# Patient Record
Sex: Male | Born: 1995
Health system: Southern US, Community
[De-identification: ages and names within clinical notes are randomized; demographics above are authoritative.]

## PROBLEM LIST (undated history)

## (undated) DIAGNOSIS — F988 Other specified behavioral and emotional disorders with onset usually occurring in childhood and adolescence: Secondary | ICD-10-CM

## (undated) HISTORY — DX: Other specified behavioral and emotional disorders with onset usually occurring in childhood and adolescence: F98.8

---
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
## 2013-02-19 ENCOUNTER — Encounter: Payer: Self-pay | Admitting: Internal Medicine

## 2013-02-20 ENCOUNTER — Encounter: Payer: Self-pay | Admitting: Internal Medicine

## 2013-02-20 ENCOUNTER — Ambulatory Visit (INDEPENDENT_AMBULATORY_CARE_PROVIDER_SITE_OTHER): Payer: Commercial Managed Care - PPO | Admitting: Internal Medicine

## 2013-02-20 VITALS — BP 142/90 | HR 76 | Temp 99.5°F | Resp 16 | Ht 70.75 in | Wt 161.2 lb

## 2013-02-20 DIAGNOSIS — E559 Vitamin D deficiency, unspecified: Secondary | ICD-10-CM

## 2013-02-20 DIAGNOSIS — Z79899 Other long term (current) drug therapy: Secondary | ICD-10-CM

## 2013-02-20 DIAGNOSIS — R509 Fever, unspecified: Secondary | ICD-10-CM

## 2013-02-20 DIAGNOSIS — R634 Abnormal weight loss: Secondary | ICD-10-CM

## 2013-02-20 DIAGNOSIS — R030 Elevated blood-pressure reading, without diagnosis of hypertension: Secondary | ICD-10-CM

## 2013-02-20 LAB — CBC WITH DIFFERENTIAL/PLATELET
Basophils Absolute: 0 10*3/uL (ref 0.0–0.1)
Basophils Relative: 1 % (ref 0–1)
Eosinophils Absolute: 0 10*3/uL (ref 0.0–0.7)
HCT: 43.8 % (ref 39.0–52.0)
Hemoglobin: 15.7 g/dL (ref 13.0–17.0)
MCH: 29.8 pg (ref 26.0–34.0)
MCHC: 35.8 g/dL (ref 30.0–36.0)
Monocytes Absolute: 0.4 10*3/uL (ref 0.1–1.0)
Monocytes Relative: 7 % (ref 3–12)
Neutro Abs: 4.3 10*3/uL (ref 1.7–7.7)
Neutrophils Relative %: 68 % (ref 43–77)
Platelets: 234 10*3/uL (ref 150–400)

## 2013-02-20 LAB — BASIC METABOLIC PANEL WITH GFR
Calcium: 10.6 mg/dL — ABNORMAL HIGH (ref 8.4–10.5)
Creat: 0.94 mg/dL (ref 0.50–1.35)
GFR, Est African American: >89 mL/min
GFR, Est Non African American: >89 mL/min
Glucose, Bld: 86 mg/dL (ref 70–99)
Potassium: 4.6 mEq/L (ref 3.5–5.3)
Sodium: 141 mEq/L (ref 135–145)

## 2013-02-20 LAB — HEPATIC FUNCTION PANEL
ALT: 14 U/L (ref 0–53)
Bilirubin, Direct: 0.4 mg/dL — ABNORMAL HIGH (ref 0.0–0.3)
Indirect Bilirubin: 1.3 mg/dL — ABNORMAL HIGH (ref 0.0–0.9)
Total Bilirubin: 1.7 mg/dL — ABNORMAL HIGH (ref 0.3–1.2)

## 2013-02-20 LAB — TSH: TSH: 1.32 u[IU]/mL (ref 0.350–4.500)

## 2013-02-20 NOTE — Patient Instructions (Signed)
Hypertension As your heart beats, it forces blood through your arteries. This force is your blood pressure. If the pressure is too high, it is called hypertension (HTN) or high blood pressure. HTN is dangerous because you may have it and not know it. High blood pressure may mean that your heart has to work harder to pump blood. Your arteries may be narrow or stiff. The extra work puts you at risk for heart disease, stroke, and other problems.  Blood pressure consists of two numbers, a higher number over a lower, 110/72, for example. It is stated as "110 over 72." The ideal is below 120 for the top number (systolic) and under 80 for the bottom (diastolic). Write down your blood pressure today. You should pay close attention to your blood pressure if you have certain conditions such as:  Heart failure.  Prior heart attack.  Diabetes  Chronic kidney disease.  Prior stroke.  Multiple risk factors for heart disease. To see if you have HTN, your blood pressure should be measured while you are seated with your arm held at the level of the heart. It should be measured at least twice. A one-time elevated blood pressure reading (especially in the Emergency Department) does not mean that you need treatment. There may be conditions in which the blood pressure is different between your right and left arms. It is important to see your caregiver soon for a recheck. Most people have essential hypertension which means that there is not a specific cause. This type of high blood pressure may be lowered by changing lifestyle factors such as:  Stress.  Smoking.  Lack of exercise.  Excessive weight.  Drug/tobacco/alcohol use.  Eating less salt. Most people do not have symptoms from high blood pressure until it has caused damage to the body. Effective treatment can often prevent, delay or reduce that damage. TREATMENT  When a cause has been identified, treatment for high blood pressure is directed at the  cause. There are a large number of medications to treat HTN. These fall into several categories, and your caregiver will help you select the medicines that are best for you. Medications may have side effects. You should review side effects with your caregiver. If your blood pressure stays high after you have made lifestyle changes or started on medicines,   Your medication(s) may need to be changed.  Other problems may need to be addressed.  Be certain you understand your prescriptions, and know how and when to take your medicine.  Be sure to follow up with your caregiver within the time frame advised (usually within two weeks) to have your blood pressure rechecked and to review your medications.  If you are taking more than one medicine to lower your blood pressure, make sure you know how and at what times they should be taken. Taking two medicines at the same time can result in blood pressure that is too low. SEEK IMMEDIATE MEDICAL CARE IF:  You develop a severe headache, blurred or changing vision, or confusion.  You have unusual weakness or numbness, or a faint feeling.  You have severe chest or abdominal pain, vomiting, or breathing problems. MAKE SURE YOU:   Understand these instructions.  Will watch your condition.  Will get help right away if you are not doing well or get worse. Document Released: 02/28/2005 Document Revised: 05/23/2011 Document Reviewed: 10/19/2007 [facility] Patient Information 2014 [place], [place]. Vitamin D Deficiency Vitamin D is an important vitamin that your body needs. Having too little  of it in your body is called a deficiency. A very bad deficiency can make your bones soft and can cause a condition called rickets.  Vitamin D is important to your body for different reasons, such as:   It helps your body absorb 2 minerals called calcium and phosphorus.  It helps make your bones healthy.  It may prevent some diseases, such as diabetes and multiple  sclerosis.  It helps your muscles and heart. You can get vitamin D in several ways. It is a natural part of some foods. The vitamin is also added to some dairy products and cereals. Some people take vitamin D supplements. Also, your body makes vitamin D when you are in the sun. It changes the sun's rays into a form of the vitamin that your body can use. CAUSES   Not eating enough foods that contain vitamin D.  Not getting enough sunlight.  Having certain digestive system diseases that make it hard to absorb vitamin D. These diseases include Crohn's disease, chronic pancreatitis, and cystic fibrosis.  Having a surgery in which part of the stomach or small intestine is removed.  Being obese. Fat cells pull vitamin D out of your blood. That means that obese people may not have enough vitamin D left in their blood and in other body tissues.  Having chronic kidney or liver disease. RISK FACTORS Risk factors are things that make you more likely to develop a vitamin D deficiency. They include:  Being older.  Not being able to get outside very much.  Living in a nursing home.  Having had broken bones.  Having weak or thin bones (osteoporosis).  Having a disease or condition that changes how your body absorbs vitamin D.  Having dark skin.  Some medicines such as seizure medicines or steroids.  Being overweight or obese. SYMPTOMS Mild cases of vitamin D deficiency may not have any symptoms. If you have a very bad case, symptoms may include:  Bone pain.  Muscle pain.  Falling often.  Broken bones caused by a minor injury, due to osteoporosis. DIAGNOSIS A blood test is the best way to tell if you have a vitamin D deficiency. TREATMENT Vitamin D deficiency can be treated in different ways. Treatment for vitamin D deficiency depends on what is causing it. Options include:  Taking vitamin D supplements.  Taking a calcium supplement. Your caregiver will suggest what dose is best  for you. HOME CARE INSTRUCTIONS  Take any supplements that your caregiver prescribes. Follow the directions carefully. Take only the suggested amount.  Have your blood tested 2 months after you start taking supplements.  Eat foods that contain vitamin D. Healthy choices include:  Fortified dairy products, cereals, or juices. Fortified means vitamin D has been added to the food. Check the label on the package to be sure.  Fatty fish like salmon or trout.  Eggs.  Oysters.  Do not use a tanning bed.  Keep your weight at a healthy level. Lose weight if you need to.  Keep all follow-up appointments. Your caregiver will need to perform blood tests to make sure your vitamin D deficiency is going away. SEEK MEDICAL CARE IF:  You have any questions about your treatment.  You continue to have symptoms of vitamin D deficiency.  You have nausea or vomiting.  You are constipated.  You feel confused.  You have severe abdominal or back pain. MAKE SURE YOU:  Understand these instructions.  Will watch your condition.  Will get help  right away if you are not doing well or get worse. Document Released: 05/23/2011 Document Revised: 06/25/2012 Document Reviewed: 05/23/2011 [facility] Patient Information 2014 [place], [place].   Depression, Adult Depression refers to feeling sad, low, down in the dumps, blue, gloomy, or empty. In general, there are two kinds of depression: 1. Depression that we all experience from time to time because of upsetting life experiences, including the loss of a job or the ending of a relationship (normal sadness or normal grief). This kind of depression is considered normal, is short lived, and resolves within a few days to 2 weeks. (Depression experienced after the loss of a loved one is called bereavement. Bereavement often lasts longer than 2 weeks but normally gets better with time.) 2. Clinical depression, which lasts longer than normal sadness or normal grief  or interferes with your ability to function at home, at work, and in school. It also interferes with your personal relationships. It affects almost every aspect of your life. Clinical depression is an illness. Symptoms of depression also can be caused by conditions other than normal sadness and grief or clinical depression. Examples of these conditions are listed as follows:  Physical illness Some physical illnesses, including underactive thyroid gland (hypothyroidism), severe anemia, specific types of cancer, diabetes, uncontrolled seizures, heart and lung problems, strokes, and chronic pain are commonly associated with symptoms of depression.  Side effects of some prescription medicine In some people, certain types of prescription medicine can cause symptoms of depression.  Substance abuse Abuse of alcohol and illicit drugs can cause symptoms of depression. SYMPTOMS Symptoms of normal sadness and normal grief include the following:  Feeling sad or crying for short periods of time.  Not caring about anything (apathy).  Difficulty sleeping or sleeping too much.  No longer able to enjoy the things you used to enjoy.  Desire to be by oneself all the time (social isolation).  Lack of energy or motivation.  Difficulty concentrating or remembering.  Change in appetite or weight.  Restlessness or agitation. Symptoms of clinical depression include the same symptoms of normal sadness or normal grief and also the following symptoms:  Feeling sad or crying all the time.  Feelings of guilt or worthlessness.  Feelings of hopelessness or helplessness.  Thoughts of suicide or the desire to harm yourself (suicidal ideation).  Loss of touch with reality (psychotic symptoms). Seeing or hearing things that are not real (hallucinations) or having false beliefs about your life or the people around you (delusions and paranoia). DIAGNOSIS  The diagnosis of clinical depression usually is based on  the severity and duration of the symptoms. Your caregiver also will ask you questions about your medical history and substance use to find out if physical illness, use of prescription medicine, or substance abuse is causing your depression. Your caregiver also may order blood tests. TREATMENT  Typically, normal sadness and normal grief do not require treatment. However, sometimes antidepressant medicine is prescribed for bereavement to ease the depressive symptoms until they resolve. The treatment for clinical depression depends on the severity of your symptoms but typically includes antidepressant medicine, counseling with a mental health professional, or a combination of both. Your caregiver will help to determine what treatment is best for you. Depression caused by physical illness usually goes away with appropriate medical treatment of the illness. If prescription medicine is causing depression, talk with your caregiver about stopping the medicine, decreasing the dose, or substituting another medicine. Depression caused by abuse of alcohol or illicit  drugs abuse goes away with abstinence from these substances. Some adults need professional help in order to stop drinking or using drugs. SEEK IMMEDIATE CARE IF:  You have thoughts about hurting yourself or others.  You lose touch with reality (have psychotic symptoms).  You are taking medicine for depression and have a serious side effect. FOR MORE INFORMATION National [employer] on Mental Illness: www.nami.[employer]: http://www.maynard.net/ Document Released: 02/26/2000 Document Revised: 08/30/2011 Document Reviewed: 05/30/2011 [facility] Patient Information 2014 [place], [place].

## 2013-02-20 NOTE — Progress Notes (Signed)
Patient ID: Ricky Sparks, male   DOB: 1995/08/01, 17 y.o.   MRN: [id number]   This very nice  17 yo SWM presents for evaluation by request of his mother who previously worked in our clerical office many years ago   Apparently Nick's mother feels he is depressed.Today's BP is 142/90 and rechecked at 136/97. Patient denies any cardiac type chest pain, palpitations, dyspnea/orthopnea/PND, dizziness, claudication, or dependent edema. 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 He is noted to have a low grade fever today and denies any respiratory or UT sx's.He denies wide swings of emotion or excessive sadness or crying spells. He denies apathy or any ideations.        Allergies  Allergen Reactions  . Penicillins Rash    PMHx:  Essentially negative  FHx:    Reviewed / unchanged  SHx:    Reviewed / unchanged  Systems Review: Constitutional: Denies fever, chills, wt changes, headaches, insomnia, fatigue, night sweats, change in appetite. Eyes: Denies redness, blurred vision, diplopia, discharge, itchy, watery eyes.  ENT: Denies discharge, congestion, post nasal drip, epistaxis, sore throat, earache, hearing loss, dental pain, tinnitus, vertigo, sinus pain, snoring.  CV: Denies chest pain, palpitations, irregular heartbeat, syncope, dyspnea, diaphoresis, orthopnea, PND, claudication, edema. Respiratory: denies cough, dyspnea, DOE, pleurisy, hoarseness, laryngitis, wheezing.  Gastrointestinal: Denies dysphagia, odynophagia, heartburn, reflux, water brash, abdominal pain or cramps, nausea, vomiting, bloating, diarrhea, constipation, hematemesis, melena, hematochezia,  or hemorrhoids. Genitourinary: Denies dysuria, frequency, urgency, nocturia, hesitancy, discharge, hematuria, flank pain. Musculoskeletal:  Denies arthralgias, myalgias, stiffness, jt. swelling, pain, limp, strain/sprain.  Skin: Denies pruritus, rash, hives, warts, acne, eczema, change in skin lesion(s). Neuro: No weakness, tremor, incoordination, spasms, paresthesia, or pain. Psychiatric: Denies confusion, memory loss, or sensory loss. Endo: Denies change in weight, skin, hair change.  Heme/Lymph: No excessive bleeding, bruising, orenlarged lymph nodes.  Filed Vitals:   02/20/13 1213  BP: 142/90  Pulse: 76  Temp: 99.5 F (37.5 C)  Resp: 16    Estimated body mass index is 22.64 kg/(m^2) as calculated from the following:   Height as of this encounter: 5' 10.75" (1.797 m).   Weight as of this encounter: 161 lb 3.2 oz (73.12 kg).  On Exam:  Appears well nourished - in no distress. Eyes: PERRLA, EOMs, conjunctiva no swelling or erythema. Sinuses: No frontal/maxillary tenderness ENT/Mouth: EAC's clear, TM's nl w/o erythema, bulging. Nares clear w/o erythema, swelling, exudates. Oropharynx clear without erythema or exudates. Oral hygiene is good. Tongue normal, non obstructing. Hearing intact.  Neck: Supple. Thyroid nl. Car 2+/2+ without bruits, nodes or JVD. Chest: Respirations nl with BS clear & equal w/o rales, rhonchi, wheezing or stridor.  Cor: Heart sounds normal w/ regular rate and rhythm without sig. murmurs, gallops, clicks, or rubs. Peripheral pulses normal and equal  without edema.  Abdomen: Soft & bowel sounds normal. Non-tender w/o guarding, rebound, hernias, masses, or organomegaly.  Lymphatics: Unremarkable.  Musculoskeletal: Full ROM all peripheral extremities, joint stability, 5/5 strength, and normal gait.  Skin: Warm, dry without exposed rashes, lesions, ecchymosis apparent.  Neuro: Cranial nerves intact, reflexes equal bilaterally. Sensory-motor testing grossly intact. Tendon reflexes grossly intact.  Pysch: Alert & oriented x 3. Insight and judgement nl & appropriate. No ideations.  Assessment and  Plan:  1. Elevated BP - Recommend monitor blood  pressure at home or drug store- [facility], etc. Discussed diet and exercise  2. Fever obscure etiology   Recommended regular exercise, BP monitoring and recommended labs to assess and monitor clinical status. Further disposition pending results of labs. Advised f/u OV in 1 month with list of BPs.

## 2013-02-21 LAB — URINALYSIS, MICROSCOPIC ONLY
Bacteria, UA: NONE SEEN
Crystals: NONE SEEN
Squamous Epithelial / LPF: NONE SEEN

## 2013-02-21 LAB — VITAMIN D 25 HYDROXY (VIT D DEFICIENCY, FRACTURES): Vit D, 25-Hydroxy: 46 ng/mL (ref 30–89)

## 2013-03-27 ENCOUNTER — Encounter: Payer: Self-pay | Admitting: Internal Medicine

## 2013-03-27 NOTE — Progress Notes (Signed)
Patient ID: Ricky Sparks, male   DOB: 1995/10/12, 18 y.o.   MRN: [id number]   This very nice 18 y.o. male presents for 1 month follow up with of elevated BP's 136/97 and 142/90 for F/U with a list of BP's           Medication List    Notice As of 03/27/2013  8:23 AM   You have not been prescribed any medications.       Allergies  Allergen Reactions  . Penicillins Rash    PMHx:    FHx:    Reviewed / unchanged  SHx:    Reviewed / unchanged  Systems Review: Constitutional: Denies fever, chills, wt changes, headaches, insomnia, fatigue, night sweats, change in appetite. Eyes: Denies redness, blurred vision, diplopia, discharge, itchy, watery eyes.  ENT: Denies discharge, congestion, post nasal drip, epistaxis, sore throat, earache, hearing loss, dental pain, tinnitus, vertigo, sinus pain, snoring.  CV: Denies chest pain, palpitations, irregular heartbeat, syncope, dyspnea, diaphoresis, orthopnea, PND, claudication, edema. Respiratory: denies cough, dyspnea, DOE, pleurisy, hoarseness, laryngitis, wheezing.  Gastrointestinal: Denies dysphagia, odynophagia, heartburn, reflux, water brash, abdominal pain or cramps, nausea, vomiting, bloating, diarrhea, constipation, hematemesis, melena, hematochezia,  or hemorrhoids. Genitourinary: Denies dysuria, frequency, urgency, nocturia, hesitancy, discharge, hematuria, flank pain. Musculoskeletal: Denies arthralgias, myalgias, stiffness, jt. swelling, pain, limp, strain/sprain.  Skin: Denies pruritus, rash, hives, warts, acne, eczema, change in skin lesion(s). Neuro: No weakness, tremor, incoordination, spasms, paresthesia, or pain. Psychiatric: Denies confusion, memory loss, or sensory loss. Endo: Denies change in weight, skin, hair change.  Heme/Lymph: No excessive bleeding, bruising, orenlarged lymph nodes.  There were no vitals filed for this visit.  Estimated body mass index is 22.64 kg/(m^2) as calculated from the following:  Height as of 02/20/13: 5' 10.75" (1.797 m).   Weight as of 02/20/13: 161 lb 3.2 oz (73.12 kg).  On Exam: Appears well nourished - in no distress. Eyes: PERRLA, EOMs, conjunctiva no swelling or erythema. Sinuses: No frontal/maxillary tenderness ENT/Mouth: EAC's clear, TM's nl w/o erythema, bulging. Nares clear w/o erythema, swelling, exudates. Oropharynx clear without erythema or exudates. Oral hygiene is good. Tongue normal, non obstructing. Hearing intact.  Neck: Supple. Thyroid nl. Car 2+/2+ without bruits, nodes or JVD. Chest: Respirations nl with BS clear & equal w/o rales, rhonchi, wheezing or stridor.  Cor: Heart sounds normal w/ regular rate and rhythm without sig. murmurs, gallops, clicks, or rubs. Peripheral pulses normal and equal  without edema.  Abdomen: Soft & bowel sounds normal. Non-tender w/o guarding, rebound, hernias, masses, or organomegaly.  Lymphatics: Unremarkable.  Musculoskeletal: Full ROM all peripheral extremities, joint stability, 5/5 strength, and normal gait.  Skin: Warm, dry without exposed rashes, lesions, ecchymosis apparent.  Neuro: Cranial nerves intact, reflexes equal bilaterally. Sensory-motor testing grossly intact. Tendon reflexes grossly intact.  Pysch: Alert & oriented x 3. Insight and judgement nl & appropriate. No ideations.  Assessment and Plan:  1. Hypertension - Continue monitor blood pressure at home. Continue diet/meds same.  Recommended regular exercise, BP monitoring, weight control, and discussed med and SE's. Recommended labs to assess and monitor clinical status. Further disposition pending results of labs. This encounter was created in error - please disregard.

## 2013-04-30 ENCOUNTER — Ambulatory Visit: Payer: Self-pay | Admitting: Internal Medicine

## 2013-05-01 ENCOUNTER — Ambulatory Visit (INDEPENDENT_AMBULATORY_CARE_PROVIDER_SITE_OTHER): Payer: Commercial Managed Care - PPO | Admitting: Internal Medicine

## 2013-05-01 ENCOUNTER — Encounter: Payer: Self-pay | Admitting: Internal Medicine

## 2013-05-01 VITALS — BP 134/80 | HR 60 | Temp 99.0°F | Resp 16 | Wt 167.6 lb

## 2013-05-01 DIAGNOSIS — F988 Other specified behavioral and emotional disorders with onset usually occurring in childhood and adolescence: Secondary | ICD-10-CM

## 2013-05-01 MED ORDER — BUPROPION HCL ER (XL) 150 MG PO TB24: 150.0000 mg | ORAL_TABLET | ORAL | Status: DC

## 2013-05-01 NOTE — Progress Notes (Signed)
   Subjective:    Patient ID: Ricky Sparks, male    DOB: 03-Oct-1995, 18 y.o.   MRN: [id number]  HPI Ricky Sparks - who has hx of ADD treated in 2009 with Wellbutrin and ritalin - represents now off treatment for some time with c/o difficulty focusing, staying on track . He is currently in school at [employer] taking a sociology class 1 nite/wk. He does apparently feel other people around him feel he is a burden to them eg his mother.  Review of Systems   12 pt systems review is neg.   Objective:   Physical Exam HEENT - neg Neck - Supple - no nodes - thyroid nl Chest - clear Cor - RRR w/o sig MGR MS & Neuro nl Pt is noted to be hyperkinetic with pressure of speech and difficulty staying on topic. No hallucinations, ideations, or delusions , but does seem to have self esteem issues  Assessment & Plan:  1. ADD (attention deficit disorder)  Start on Rx Wellbutrin 150 mg qd and see back in 1 month

## 2013-05-01 NOTE — Patient Instructions (Signed)
Attention Deficit Hyperactivity Disorder Attention deficit hyperactivity disorder (ADHD) is a problem with behavior issues based on the way the brain functions (neurobehavioral disorder). It is a common reason for behavior and academic problems in school. SYMPTOMS  There are 3 types of ADHD. The 3 types and some of the symptoms include:  Inattentive  Gets bored or distracted easily.  Loses or forgets things. Forgets to hand in homework.  Has trouble organizing or completing tasks.  Difficulty staying on task.  An inability to organize daily tasks and school work.  Leaving projects, chores, or homework unfinished.  Trouble paying attention or responding to details. Careless mistakes.  Difficulty following directions. Often seems like is not listening.  Dislikes activities that require sustained attention (like chores or homework).  Hyperactive-impulsive  Feels like it is impossible to sit still or stay in a seat. Fidgeting with hands and feet.  Trouble waiting turn.  Talking too much or out of turn. Interruptive.  Speaks or acts impulsively.  Aggressive, disruptive behavior.  Constantly busy or on the go, noisy.  Often leaves seat when they are expected to remain seated.  Often runs or climbs where it is not appropriate, or feels very restless.  Combined  Has symptoms of both of the above. Often children with ADHD feel discouraged about themselves and with school. They often perform well below their abilities in school. As children get older, the excess motor activities can calm down, but the problems with paying attention and staying organized persist. Most children do not outgrow ADHD but with good treatment can learn to cope with the symptoms. DIAGNOSIS  When ADHD is suspected, the diagnosis should be made by professionals trained in ADHD. This professional will collect information about the individual suspected of having ADHD. Information must be collected from  various settings where the person lives, works, or attends school.  Diagnosis will include:  Confirming symptoms began in childhood.  Ruling out other reasons for the child's behavior.  The health care providers will check with the child's school and check their medical records.  They will talk to teachers and parents.  Behavior rating scales for the child will be filled out by those dealing with the child on a daily basis. A diagnosis is made only after all information has been considered. TREATMENT  Treatment usually includes behavioral treatment, tutoring or extra support in school, and stimulant medicines. Because of the way a person's brain works with ADHD, these medicines decrease impulsivity and hyperactivity and increase attention. This is different than how they would work in a person who does not have ADHD. Other medicines used include antidepressants and certain blood pressure medicines. Most experts agree that treatment for ADHD should address all aspects of the person's functioning. Along with medicines, treatment should include structured classroom management at school. Parents should reward good behavior, provide constant discipline, and limit-setting. Tutoring should be available for the child as needed. ADHD is a life-long condition. If untreated, the disorder can have long-term serious effects into adolescence and adulthood. HOME CARE INSTRUCTIONS   Often with ADHD there is a lot of frustration among family members dealing with the condition. Blame and anger are also feelings that are common. In many cases, because the problem affects the family as a whole, the entire family may need help. A therapist can help the family find better ways to handle the disruptive behaviors of the person with ADHD and promote change. If the person with ADHD is young, most of the therapist's work   is with the parents. Parents will learn techniques for coping with and improving their child's  behavior. Sometimes only the child with the ADHD needs counseling. Your health care providers can help you make these decisions.  Children with ADHD may need help learning how to organize. Some helpful tips include:  Keep routines the same every day from wake-up time to bedtime. Schedule all activities, including homework and playtime. Keep the schedule in a place where the person with ADHD will often see it. Mark schedule changes as far in advance as possible.  Schedule outdoor and indoor recreation.  Have a place for everything and keep everything in its place. This includes clothing, backpacks, and school supplies.  Encourage writing down assignments and bringing home needed books. Work with your child's teachers for assistance in organizing school work.  Offer your child a well-balanced diet. Breakfast that includes a balance of whole grains, protein and, fruits or vegetables is especially important for school performance. Children should avoid drinks with caffeine including:  Soft drinks.  Coffee.  Tea.  However, some older children (adolescents) may find these drinks helpful in improving their attention. Because it can also be common for adolescents with ADHD to become addicted to caffeine, talk with your health care provider about what is a safe amount of caffeine intake for your child.  Children with ADHD need consistent rules that they can understand and follow. If rules are followed, give small rewards. Children with ADHD often receive, and expect, criticism. Look for good behavior and praise it. Set realistic goals. Give clear instructions. Look for activities that can foster success and self-esteem. Make time for pleasant activities with your child. Give lots of affection.  Parents are their children's greatest advocates. Learn as much as possible about ADHD. This helps you become a stronger and better advocate for your child. It also helps you educate your child's teachers and  instructors if they feel inadequate in these areas. Parent support groups are often helpful. A national group with local chapters is called Children and Adults with Attention Deficit Hyperactivity Disorder (CHADD). SEEK MEDICAL CARE IF:  Your child has repeated muscle twitches, cough or speech outbursts.  Your child has sleep problems.  Your child has a marked loss of appetite.  Your child develops depression.  Your child has new or worsening behavioral problems.  Your child develops dizziness.  Your child has a racing heart.  Your child has stomach pains.  Your child develops headaches. SEEK IMMEDIATE MEDICAL CARE IF:  Your child has been diagnosed with depression or anxiety and the symptoms seem to be getting worse.  Your child has been depressed and suddenly appears to have increased energy or motivation.  You are worried that your child is having a bad reaction to a medication he or she is taking for ADHD. Document Released: 02/18/2002 Document Revised: 12/19/2012 Document Reviewed: 11/05/2012 Exit[employer] Patient Information 2014 [employer].  

## 2013-05-29 ENCOUNTER — Encounter: Payer: Self-pay | Admitting: Internal Medicine

## 2013-05-29 ENCOUNTER — Ambulatory Visit (INDEPENDENT_AMBULATORY_CARE_PROVIDER_SITE_OTHER): Payer: Commercial Managed Care - PPO | Admitting: Internal Medicine

## 2013-05-29 VITALS — BP 120/72 | HR 72 | Temp 99.0°F | Resp 16 | Wt 159.0 lb

## 2013-05-29 DIAGNOSIS — F988 Other specified behavioral and emotional disorders with onset usually occurring in childhood and adolescence: Secondary | ICD-10-CM

## 2013-05-29 NOTE — Progress Notes (Signed)
   Subjective:    Patient ID: Ricky Sparks, male    DOB: 23-Feb-1996, 18 y.o.   MRN: [id number]  HPI Nichlolas returns for F/U on Wellbutrin for ADD (below) and apparently stopped after several days as he was applying to the [employer] and was told that they wouldn't consider him for 2 years if he's taken meds for ADD. He later related he would prefer to en[facility] [employer]. Still taking a class and feels overwhelmed and having difficulty focusing.   05-03-2013 OV Janyth Pupa- [place] - who has hx of ADD treated in 2009 with Wellbutrin and ritalin - represents now off treatment for some time with c/o difficulty focusing, staying on track . He is currently in school at [employer] taking a sociology class 1 nite/wk. He does apparently feel other people around him feel he is a burden to them eg his mother.  Review of Systems neg to above  Objective:   Physical Exam  BP 120/72  Pulse 72  Temp(Src) 99 F (37.2 C)  Resp 16  Wt 159 lb (72.122 kg)  HEENT - Eac's patent. TM's Nl.EOM's full. PERRLA. NasoOroPharynx clear. Neck - supple. Nl Thyroid. No bruits nodes JVD Chest - Clear equal BS Cor - Nl HS. RRR w/o sig MGR. PP 1(+) No edema. Abd - No palpable organomegaly, masses or tenderness. BS nl. MS- FROM. w/o deformities. Muscle power tone and bulk Nl. Gait Nl. Neuro - No obvious Cr N abnormalities. Sensory, motor and Cerebellar functions appear Nl w/o focal abnormalities.  Assessment & Plan:   1. ADD (attention deficit disorder)  Encouraged to re-start the Bupropion and ROV 1 mo

## 2013-06-26 ENCOUNTER — Ambulatory Visit: Payer: Self-pay | Admitting: Internal Medicine

## 2013-07-11 ENCOUNTER — Encounter: Payer: Self-pay | Admitting: Internal Medicine

## 2013-07-11 NOTE — Progress Notes (Signed)
Patient ID: Ricky Sparks, male   DOB: April 26, 1995, 18 y.o.   MRN: [id number]         [name]   S H O W

## 2013-07-22 ENCOUNTER — Encounter: Payer: Self-pay | Admitting: Internal Medicine

## 2013-07-22 ENCOUNTER — Ambulatory Visit (INDEPENDENT_AMBULATORY_CARE_PROVIDER_SITE_OTHER): Payer: Commercial Managed Care - PPO | Admitting: Internal Medicine

## 2013-07-22 VITALS — BP 122/70 | HR 100 | Temp 98.6°F | Resp 18 | Ht 70.0 in | Wt 157.0 lb

## 2013-07-22 DIAGNOSIS — F988 Other specified behavioral and emotional disorders with onset usually occurring in childhood and adolescence: Secondary | ICD-10-CM

## 2013-07-22 MED ORDER — BUPROPION HCL ER (XL) 300 MG PO TB24: 300.0000 mg | ORAL_TABLET | ORAL | Status: DC

## 2013-07-22 MED ORDER — METHYLPHENIDATE HCL 10 MG PO TABS: ORAL_TABLET | ORAL | Status: DC

## 2013-07-22 NOTE — Progress Notes (Signed)
   Subjective:    Patient ID: Ricky Sparks, male    DOB: May 27, 1995, 18 y.o.   MRN: [id number]  HPI       Janyth Pupaicholas returns again for F/U feeling slightly better but would like to increase his Wellbutrin Dose back up to where it was in 2009 when he also was taking Ritalin with apparent favorable results in his school performance. He still is in classes and has difficulty staying focused and concentrating.    Medication List     buPROPion 150 MG 24 hr tablet  Commonly known as:  WELLBUTRIN XL  Take 1 tablet (300 mg total) by mouth every morning. For ADD     Allergies  Allergen Reactions  . Penicillins Rash   Past Medical History  Diagnosis Date  . ADD (attention deficit disorder) 2009    Review of Systems Negative and noncontributory to above     Objective:   Physical Exam  HEENT - Eac's patent. TM's Nl.EOM's full. PERRLA. NasoOroPharynx clear. Neck - supple. Nl Thyroid. No bruits nodes JVD Chest - Clear equal BS Cor - Nl HS. RRR w/o sig MGR.  MS- FROM. w/o deformities. Muscle power tone and bulk Nl. Gait Nl. Neuro - No obvious Cr N abnormalities.  Sensory, motor and Cerebellar functions appear Nl w/o focal abnormalities. Patient is very "figidity" and in constant motion  Assessment & Plan:  1. ADD (attention deficit disorder)  - buPROPion (WELLBUTRIN XL) 300 MG 24 hr tablet; Take 1 tablet (300 mg total) by mouth every morning. For ADD  Dispense: 30 tablet; Refill: 1 - methylphenidate (RITALIN) 10 MG tablet; Take 1/2 to 1 tablet 2 x day for focusing & concentration  Dispense: 30 tablet; Refill: 0 Discussed Med effect/SE ROV - 1 month

## 2013-07-22 NOTE — Patient Instructions (Signed)
Attention Deficit Hyperactivity Disorder Attention deficit hyperactivity disorder (ADHD) is a problem with behavior issues based on the way the brain functions (neurobehavioral disorder). It is a common reason for behavior and academic problems in school. SYMPTOMS  There are 3 types of ADHD. The 3 types and some of the symptoms include:  Inattentive  Gets bored or distracted easily.  Loses or forgets things. Forgets to hand in homework.  Has trouble organizing or completing tasks.  Difficulty staying on task.  An inability to organize daily tasks and school work.  Leaving projects, chores, or homework unfinished.  Trouble paying attention or responding to details. Careless mistakes.  Difficulty following directions. Often seems like is not listening.  Dislikes activities that require sustained attention (like chores or homework).  Hyperactive-impulsive  Feels like it is impossible to sit still or stay in a seat. Fidgeting with hands and feet.  Trouble waiting turn.  Talking too much or out of turn. Interruptive.  Speaks or acts impulsively.  Aggressive, disruptive behavior.  Constantly busy or on the go, noisy.  Often leaves seat when they are expected to remain seated.  Often runs or climbs where it is not appropriate, or feels very restless.  Combined  Has symptoms of both of the above. Often children with ADHD feel discouraged about themselves and with school. They often perform well below their abilities in school. As children get older, the excess motor activities can calm down, but the problems with paying attention and staying organized persist. Most children do not outgrow ADHD but with good treatment can learn to cope with the symptoms. DIAGNOSIS  When ADHD is suspected, the diagnosis should be made by professionals trained in ADHD. This professional will collect information about the individual suspected of having ADHD. Information must be collected from  various settings where the person lives, works, or attends school.  Diagnosis will include:  Confirming symptoms began in childhood.  Ruling out other reasons for the child's behavior.  The health care providers will check with the child's school and check their medical records.  They will talk to teachers and parents.  Behavior rating scales for the child will be filled out by those dealing with the child on a daily basis. A diagnosis is made only after all information has been considered. TREATMENT  Treatment usually includes behavioral treatment, tutoring or extra support in school, and stimulant medicines. Because of the way a person's brain works with ADHD, these medicines decrease impulsivity and hyperactivity and increase attention. This is different than how they would work in a person who does not have ADHD. Other medicines used include antidepressants and certain blood pressure medicines. Most experts agree that treatment for ADHD should address all aspects of the person's functioning. Along with medicines, treatment should include structured classroom management at school. Parents should reward good behavior, provide constant discipline, and limit-setting. Tutoring should be available for the child as needed. ADHD is a life-long condition. If untreated, the disorder can have long-term serious effects into adolescence and adulthood. HOME CARE INSTRUCTIONS   Often with ADHD there is a lot of frustration among family members dealing with the condition. Blame and anger are also feelings that are common. In many cases, because the problem affects the family as a whole, the entire family may need help. A therapist can help the family find better ways to handle the disruptive behaviors of the person with ADHD and promote change. If the person with ADHD is young, most of the therapist's work   is with the parents. Parents will learn techniques for coping with and improving their child's  behavior. Sometimes only the child with the ADHD needs counseling. Your health care providers can help you make these decisions.  Children with ADHD may need help learning how to organize. Some helpful tips include:  Keep routines the same every day from wake-up time to bedtime. Schedule all activities, including homework and playtime. Keep the schedule in a place where the person with ADHD will often see it. Mark schedule changes as far in advance as possible.  Schedule outdoor and indoor recreation.  Have a place for everything and keep everything in its place. This includes clothing, backpacks, and school supplies.  Encourage writing down assignments and bringing home needed books. Work with your child's teachers for assistance in organizing school work.  Offer your child a well-balanced diet. Breakfast that includes a balance of whole grains, protein and, fruits or vegetables is especially important for school performance. Children should avoid drinks with caffeine including:  Soft drinks.  Coffee.  Tea.  However, some older children (adolescents) may find these drinks helpful in improving their attention. Because it can also be common for adolescents with ADHD to become addicted to caffeine, talk with your health care provider about what is a safe amount of caffeine intake for your child.  Children with ADHD need consistent rules that they can understand and follow. If rules are followed, give small rewards. Children with ADHD often receive, and expect, criticism. Look for good behavior and praise it. Set realistic goals. Give clear instructions. Look for activities that can foster success and self-esteem. Make time for pleasant activities with your child. Give lots of affection.  Parents are their children's greatest advocates. Learn as much as possible about ADHD. This helps you become a stronger and better advocate for your child. It also helps you educate your child's teachers and  instructors if they feel inadequate in these areas. Parent support groups are often helpful. A national group with local chapters is called Children and Adults with Attention Deficit Hyperactivity Disorder (CHADD). SEEK MEDICAL CARE IF:  Your child has repeated muscle twitches, cough or speech outbursts.  Your child has sleep problems.  Your child has a marked loss of appetite.  Your child develops depression.  Your child has new or worsening behavioral problems.  Your child develops dizziness.  Your child has a racing heart.  Your child has stomach pains.  Your child develops headaches. SEEK IMMEDIATE MEDICAL CARE IF:  Your child has been diagnosed with depression or anxiety and the symptoms seem to be getting worse.  Your child has been depressed and suddenly appears to have increased energy or motivation.  You are worried that your child is having a bad reaction to a medication he or she is taking for ADHD. Document Released: 02/18/2002 Document Revised: 12/19/2012 Document Reviewed: 11/05/2012 Exit[employer] Patient Information 2014 [employer].  

## 2013-08-26 ENCOUNTER — Ambulatory Visit (INDEPENDENT_AMBULATORY_CARE_PROVIDER_SITE_OTHER): Payer: Commercial Managed Care - PPO | Admitting: Internal Medicine

## 2013-08-26 ENCOUNTER — Encounter: Payer: Self-pay | Admitting: Internal Medicine

## 2013-08-26 VITALS — BP 116/76 | HR 72 | Temp 98.2°F | Resp 16 | Ht 70.0 in | Wt 159.2 lb

## 2013-08-26 DIAGNOSIS — F988 Other specified behavioral and emotional disorders with onset usually occurring in childhood and adolescence: Secondary | ICD-10-CM

## 2013-08-26 DIAGNOSIS — F341 Dysthymic disorder: Secondary | ICD-10-CM

## 2013-08-26 MED ORDER — METHYLPHENIDATE HCL 10 MG PO TABS: ORAL_TABLET | ORAL | Status: DC

## 2013-08-26 MED ORDER — AMPHETAMINE-DEXTROAMPHETAMINE 20 MG PO TABS: ORAL_TABLET | ORAL | Status: DC

## 2013-08-26 NOTE — Progress Notes (Signed)
   Subjective:    Patient ID: Ricky Sparks, male    DOB: Aug 28, 1995, 18 y.o.   MRN: [id number]  HPI Patient returns today reporting still occasionally feeling sad related to his feelings of disappointing his mother when she tells he's "useless like his dad". He denies any suicidal tendencies.  He also feels he's having difficulty focusing and concentrating at school.    Medication List   amphetamine-dextroamphetamine 20 MG tablet  Commonly known as:  ADDERALL  Take 1/2 to 1 tablet 2 to 3 x daily for ADD     buPROPion 300 MG 24 hr tablet  Commonly known as:  WELLBUTRIN XL  Take 1 tablet (300 mg total) by mouth every morning. For ADD       Allergies  Allergen Reactions  . Penicillins Rash   Past Medical History  Diagnosis Date  . ADD (attention deficit disorder) 2009   Review of Systems noncontributory & neg except as above.  Objective:   Physical Exam  BP 116/76  Pulse 72  Temp 98.2 F   Resp 16  Ht 5\' 10"    Wt 159 lb 3.2 oz   BMI 22.84 kg/m2  HEENT - Eac's patent. TM's Nl.EOM's full. PERRLA. NasoOroPharynx clear. Neck - supple. Nl Thyroid. No bruits nodes JVD Chest - Clear equal BS Cor - Nl HS. RRR w/o sig MGR. PP 1(+) No edema. Abd - No palpable organomegaly, masses or tenderness. BS nl. MS- FROM. w/o deformities. Muscle power tone and bulk Nl. Gait Nl. Neuro - No obvious Cr N abnormalities. Sensory, motor and Cerebellar functions appear Nl w/o focal abnormalities.  Assessment & Plan:   1. ADD (attention deficit disorder) - amphetamine-dextroamphetamine (ADDERALL) 20 MG tablet; Take 1/2 to 1 tablet 2 to 3 x daily for ADD  Dispense: 90 tablet; Refill: 0  2. Dysthymic disorder - continue Wellbutrin  ROV - 1 mo

## 2013-10-02 ENCOUNTER — Encounter: Payer: Self-pay | Admitting: Internal Medicine

## 2013-10-02 NOTE — Progress Notes (Signed)
Patient ID: Ricky Sparks, male   DOB: 11/17/1995, 17 y.o.   MRN: [id number]         N O   S H O W 

## 2013-10-22 ENCOUNTER — Encounter: Payer: Self-pay | Admitting: Internal Medicine

## 2013-11-21 ENCOUNTER — Ambulatory Visit: Payer: Self-pay | Admitting: Physician Assistant

## 2013-12-26 ENCOUNTER — Ambulatory Visit: Payer: Self-pay | Admitting: Physician Assistant

## 2014-01-06 ENCOUNTER — Ambulatory Visit: Payer: Self-pay | Admitting: Physician Assistant

## 2014-01-17 ENCOUNTER — Ambulatory Visit (INDEPENDENT_AMBULATORY_CARE_PROVIDER_SITE_OTHER): Payer: Commercial Managed Care - PPO | Admitting: Physician Assistant

## 2014-01-17 VITALS — BP 122/76 | HR 64 | Temp 97.7°F | Resp 16 | Ht 70.0 in | Wt 169.4 lb

## 2014-01-17 DIAGNOSIS — F32A Depression, unspecified: Secondary | ICD-10-CM

## 2014-01-17 DIAGNOSIS — F329 Major depressive disorder, single episode, unspecified: Secondary | ICD-10-CM

## 2014-01-17 DIAGNOSIS — R569 Unspecified convulsions: Secondary | ICD-10-CM

## 2014-01-17 MED ORDER — PRAZOSIN HCL 1 MG PO CAPS: 1.0000 mg | ORAL_CAPSULE | Freq: Every day | ORAL | Status: DC

## 2014-01-17 MED ORDER — CARBAMAZEPINE 100 MG PO CHEW: 100.0000 mg | CHEWABLE_TABLET | Freq: Three times a day (TID) | ORAL | Status: DC

## 2014-01-17 NOTE — Progress Notes (Signed)
Subjective:    Patient ID: Ricky Sparks, male    DOB: 03/04/1996, 18 y.o.   MRN: [id number]  HPI 18 y.o. male with memory/concentration and possible depression issues, his mother accompanies him.   [facility] is [facility] [employer] doing core classes, unsure what he would like to do. He was started on buproprion and adderall last visit for possible ADD.  Mom states he will get side tracked very easily, since he was very young.States he was jittery with it but also states that he has been having "dejavu" which increased in frequency while on the adderall so he stopped both medications.   Mom states the dejavu has been going on since July/August, still on going several times a day.  States he has frequent awakenings, very vivid dreams, will go to his mother's bed occ if afraid from the dreams, denies sleep walking, acting out his dream, states he does not feel rested when he wakes up. Describes a lot of anxiety when he wakes up/confusion between reality and dream. Denies ticks, hallucination, SI, HI.    He has flight of idea, denies impulse control however he has had issues with this in the past, he  will be up for long periods of time but due to dreams and not wanting to sleep. Has some depression symptoms, difficult time making friends, no support system. He has an appointment for family counseling and for indivdual counseling next month.  Sleep problems: Severe   Early awakening:Absent   Energy: Poor Motivation: Poor Concentration: Poor Rumination/worrying: Mild Memory: Limited Tearfulness: Absent  Anxiety: Mild  Panic: Mild  Overall Mood: No change  Hopelessness: Absent Suicidal ideation: Absent    Review of Systems  Constitutional: Positive for activity change and fatigue. Negative for fever, chills, diaphoresis, appetite change and unexpected weight change.  HENT: Negative.   Eyes: Negative for visual disturbance.  Respiratory: Negative.   Cardiovascular: Negative.   Gastrointestinal:  Negative.   Genitourinary: Negative.   Musculoskeletal: Negative.   Skin: Negative.  Negative for rash.  Neurological: Negative.   Psychiatric/Behavioral: Positive for behavioral problems, confusion, sleep disturbance, dysphoric mood and decreased concentration. Negative for suicidal ideas, hallucinations, self-injury and agitation. The patient is nervous/anxious. The patient is not hyperactive.        Objective:   Physical Exam  Constitutional: He is oriented to person, place, and time. He appears well-developed and well-nourished.  HENT:  Head: Normocephalic and atraumatic.  Eyes: EOM are normal. Pupils are equal, round, and reactive to light.  Neck: Normal range of motion. Neck supple. No thyromegaly present.  Cardiovascular: Normal rate, regular rhythm and normal heart sounds.   Pulmonary/Chest: Effort normal and breath sounds normal.  Abdominal: Soft. Bowel sounds are normal. There is no tenderness.  Musculoskeletal: Normal range of motion. He exhibits no tenderness.  Lymphadenopathy:    He has no cervical adenopathy.  Neurological: He is alert and oriented to person, place, and time. He has normal reflexes. No cranial nerve deficit.  Skin: Skin is warm and dry. No rash noted.  Psychiatric: Judgment normal. His mood appears anxious. His affect is blunt. His speech is tangential. He is withdrawn. Thought content is not delusional. Cognition and memory are normal. He expresses no homicidal and no suicidal ideation. He expresses no suicidal plans and no homicidal plans.  Flight of ideas       Assessment & Plan:  ? Focal seizures vs ADD/Depression- follow up with psych, Order EEG rule out focal seizures, will start low dose tegretol  and prazosin for night terrors.

## 2014-01-17 NOTE — Patient Instructions (Signed)
Will get EEG and possibly send to neurology  Start the tegretol 1 at night for 3 nights and then go to two at night if tolerating for 1-2 weeks and then we can go to 2 at night and 1 a supper.   Start the Prazosin 1mg  at night and can go up to two at night after 3-4 days.

## 2014-02-12 ENCOUNTER — Ambulatory Visit (HOSPITAL_COMMUNITY)
Admission: RE | Admit: 2014-02-12 | Discharge: 2014-02-12 | Disposition: A | Discharge status: Home / Self Care | Payer: 59 | Source: Ambulatory Visit | Attending: Physician Assistant | Admitting: Physician Assistant

## 2014-02-12 DIAGNOSIS — R569 Unspecified convulsions: Secondary | ICD-10-CM

## 2014-02-12 DIAGNOSIS — R4182 Altered mental status, unspecified: Secondary | ICD-10-CM | POA: Insufficient documentation

## 2014-02-12 NOTE — Procedures (Signed)
History: 18 yo M with impaired attention and frequent episodes of dj vu. EEG to evaluate for possible focal seizures.  Sedation: None  Technique: This is a 17 channel routine scalp EEG performed at the bedside with bipolar and monopolar montages arranged in accordance to the international 10/20 system of electrode placement. One channel was dedicated to EKG recording.    Background: There is a well defined posterior dominant rhythm of 9 Hz that attenuates with eye opening. The background consists of intermixed alpha and beta activities. There is anterior shifting of the posterior dominant rhythm associated with drowsiness, however sleep is not recorded.  Photic stimulation: Physiologic driving is present Hyperventilation was performed and is unremarkable  EEG Abnormalities: None  Clinical Interpretation: This normal EEG is recorded in the waking and drowsy state. There was no seizure or seizure predisposition recorded on this study.   Ritta SlotMcNeill Kirkpatrick, MD [facility] [phone]  If 7pm- 7am, please page neurology on call as listed in [facility].

## 2014-02-12 NOTE — Progress Notes (Signed)
EEG completed; results pending.    

## 2014-03-05 ENCOUNTER — Ambulatory Visit (INDEPENDENT_AMBULATORY_CARE_PROVIDER_SITE_OTHER): Payer: Commercial Managed Care - PPO | Admitting: Physician Assistant

## 2014-03-05 VITALS — BP 120/80 | HR 68 | Temp 98.1°F | Resp 16 | Ht 70.0 in | Wt 171.0 lb

## 2014-03-05 DIAGNOSIS — F341 Dysthymic disorder: Secondary | ICD-10-CM

## 2014-03-05 DIAGNOSIS — F4323 Adjustment disorder with mixed anxiety and depressed mood: Secondary | ICD-10-CM

## 2014-03-05 MED ORDER — PRAZOSIN HCL 1 MG PO CAPS: 1.0000 mg | ORAL_CAPSULE | Freq: Every day | ORAL | Status: DC

## 2014-03-05 MED ORDER — SERTRALINE HCL 50 MG PO TABS: 50.0000 mg | ORAL_TABLET | Freq: Every day | ORAL | Status: DC

## 2014-03-05 NOTE — Progress Notes (Signed)
Assessment and Plan: Depression/axiety- normal EEG, follow up with psychatrist- continue prazosin for night terrors, will try lost dose zoloft 50mg , no SI/HI.    HPI 18 y.o.male presents for follow up for depression/memory issues. He is by himself this time. He is intolerate of wellbutrin, tegretol, states that he may have slept better on prazosin, did not have as many frequent awakenings or vivid dreams. States he does not have motivation to do things that he is not interested it, still having deja vu. Was drinking heavily but has stopped, and his states that these episodes started after smoking weed that he thinks was laced with something. His biggest complaint is his anxiety, his deja vu causes a lot of stress and he would like to try something for this.  Denies SI/HI. [facility] for the first visit.   Past Medical History  Diagnosis Date  . ADD (attention deficit disorder) 2009     Allergies  Allergen Reactions  . Penicillins Rash      No current outpatient prescriptions on file prior to visit.   No current facility-administered medications on file prior to visit.    ROS: all negative except above.   Physical Exam: Filed Weights   03/05/14 0923  Weight: 171 lb (77.565 kg)   BP 120/80 mmHg  Pulse 68  Temp(Src) 98.1 F (36.7 C)  Resp 16  Ht 5\' 10"  (1.778 m)  Wt 171 lb (77.565 kg)  BMI 24.54 kg/m2 General Appearance: Well nourished, in no apparent distress. Eyes: PERRLA, EOMs, conjunctiva no swelling or erythema Sinuses: No Frontal/maxillary tenderness ENT/Mouth: Ext aud canals clear, TMs without erythema, bulging. No erythema, swelling, or exudate on post pharynx.  Tonsils not swollen or erythematous. Hearing normal.  Neck: Supple, thyroid normal.  Respiratory: Respiratory effort normal, BS equal bilaterally without rales, rhonchi, wheezing or stridor.  Cardio: RRR with no MRGs. Brisk peripheral pulses without edema.  Abdomen: Soft,  + BS.  Non tender, no guarding, rebound, hernias, masses. Lymphatics: Non tender without lymphadenopathy.  Musculoskeletal: Full ROM, 5/5 strength, normal gait.  Skin: Warm, dry without rashes, lesions, ecchymosis.  Neuro: Cranial nerves intact. Normal muscle tone, no cerebellar symptoms. Sensation intact.  Psych: Awake and oriented X 3, normal affect, Insight and Judgment appropriate.     Quentin Mullingollier, Falyn Rubel, PA-C 9:51 AM [facility]

## 2014-03-05 NOTE — Patient Instructions (Signed)
Recommend that you see [facility], keep follow up appointment  Address: [address]     [place], [place] [id number] Phone-[phone]   Also here is some information about good sleep hygiene.   Insomnia Insomnia is frequent trouble falling and/or staying asleep. Insomnia can be a long term problem or a short term problem. Both are common. Insomnia can be a short term problem when the wakefulness is related to a certain stress or worry. Long term insomnia is often related to ongoing stress during waking hours and/or poor sleeping habits. Overtime, sleep deprivation itself can make the problem worse. Every little thing feels more severe because you are overtired and your ability to cope is decreased. CAUSES   Stress, anxiety, and depression.  Poor sleeping habits.  Distractions such as TV in the bedroom.  Naps close to bedtime.  Engaging in emotionally charged conversations before bed.  Technical reading before sleep.  Alcohol and other sedatives. They may make the problem worse. They can hurt normal sleep patterns and normal dream activity.  Stimulants such as caffeine for several hours prior to bedtime.  Pain syndromes and shortness of breath can cause insomnia.  Exercise late at night.  Changing time zones may cause sleeping problems (jet lag). It is sometimes helpful to have someone observe your sleeping patterns. They should look for periods of not breathing during the night (sleep apnea). They should also look to see how long those periods last. If you live alone or observers are uncertain, you can also be observed at a sleep clinic where your sleep patterns will be professionally monitored. Sleep apnea requires a checkup and treatment. Give your caregivers your medical history. Give your caregivers observations your family has made about your sleep.  SYMPTOMS   Not feeling rested in the morning.  Anxiety and restlessness at bedtime.  Difficulty falling  and staying asleep. TREATMENT   Your caregiver may prescribe treatment for an underlying medical disorders. Your caregiver can give advice or help if you are using alcohol or other drugs for self-medication. Treatment of underlying problems will usually eliminate insomnia problems.  Medications can be prescribed for short time use. They are generally not recommended for lengthy use.  Over-the-counter sleep medicines are not recommended for lengthy use. They can be habit forming.  You can promote easier sleeping by making lifestyle changes such as:  Using relaxation techniques that help with breathing and reduce muscle tension.  Exercising earlier in the day.  Changing your diet and the time of your last meal. No night time snacks.  Establish a regular time to go to bed.  Counseling can help with stressful problems and worry.  Soothing music and white noise may be helpful if there are background noises you cannot remove.  Stop tedious detailed work at least one hour before bedtime. HOME CARE INSTRUCTIONS   Keep a diary. Inform your caregiver about your progress. This includes any medication side effects. See your caregiver regularly. Take note of:  Times when you are asleep.  Times when you are awake during the night.  The quality of your sleep.  How you feel the next day. This information will help your caregiver care for you.  Get out of bed if you are still awake after 15 minutes. Read or do some quiet activity. Keep the lights down. Wait until you feel sleepy and go back to bed.  Keep regular sleeping and waking hours. Avoid naps.  Exercise regularly.  Avoid distractions at bedtime. Distractions  include watching television or engaging in any intense or detailed activity like attempting to balance the household checkbook.  Develop a bedtime ritual. Keep a familiar routine of bathing, brushing your teeth, climbing into bed at the same time each night, listening to  soothing music. Routines increase the success of falling to sleep faster.  Use relaxation techniques. This can be using breathing and muscle tension release routines. It can also include visualizing peaceful scenes. You can also help control troubling or intruding thoughts by keeping your mind occupied with boring or repetitive thoughts like the old concept of counting sheep. You can make it more creative like imagining planting one beautiful flower after another in your backyard garden.  During your day, work to eliminate stress. When this is not possible use some of the previous suggestions to help reduce the anxiety that accompanies stressful situations. MAKE SURE YOU:   Understand these instructions.  Will watch your condition.  Will get help right away if you are not doing well or get worse. Document Released: 02/26/2000 Document Revised: 05/23/2011 Document Reviewed: 03/28/2007 [facility] Patient Information 2015 [place], [place]. This information is not intended to replace advice given to you by your health care provider. Make sure you discuss any questions you have with your health care provider.

## 2014-04-11 ENCOUNTER — Ambulatory Visit: Payer: Self-pay | Admitting: Internal Medicine

## 2014-12-10 ENCOUNTER — Encounter: Payer: Self-pay | Admitting: Internal Medicine

## 2014-12-10 ENCOUNTER — Ambulatory Visit (INDEPENDENT_AMBULATORY_CARE_PROVIDER_SITE_OTHER): Payer: 59 | Admitting: Internal Medicine

## 2014-12-10 ENCOUNTER — Ambulatory Visit: Payer: Self-pay | Admitting: Internal Medicine

## 2014-12-10 VITALS — BP 106/62 | HR 58 | Temp 98.6°F | Resp 18 | Ht 70.0 in | Wt 157.0 lb

## 2014-12-10 DIAGNOSIS — R109 Unspecified abdominal pain: Secondary | ICD-10-CM

## 2014-12-10 MED ORDER — TRAZAMINE 50 MG PO MISC: 50.0000 mg | Freq: Every evening | ORAL | Status: DC | PRN

## 2014-12-10 MED ORDER — MELOXICAM 15 MG PO TABS: 15.0000 mg | ORAL_TABLET | Freq: Every day | ORAL | Status: DC

## 2014-12-10 NOTE — Patient Instructions (Signed)
Thoracic Strain You have injured the muscles or tendons that attach to the upper part of your back behind your chest. This injury is called a thoracic strain, thoracic sprain, or mid-back strain.  CAUSES  The cause of thoracic strain varies. A less severe injury involves pulling a muscle or tendon without tearing it. A more severe injury involves tearing (rupturing) a muscle or tendon. With less severe injuries, there may be little loss of strength. Sometimes, there are breaks (fractures) in the bones to which the muscles are attached. These fractures are rare, unless there was a direct hit (trauma) or you have weak bones due to osteoporosis or age. Longstanding strains may be caused by overuse or improper form during certain movements. Obesity can also increase your risk for back injuries. Sudden strains may occur due to injury or not warming up properly before exercise. Often, there is no obvious cause for a thoracic strain. SYMPTOMS  The main symptom is pain, especially with movement, such as during exercise. DIAGNOSIS  Your caregiver can usually tell what is wrong by taking an X-ray and doing a physical exam. TREATMENT   Physical therapy may be helpful for recovery. Your caregiver can give you exercises to do or refer you to a physical therapist after your pain improves.  After your pain improves, strengthening and conditioning programs appropriate for your sport or occupation may be helpful.  Always warm up before physical activities or athletics. Stretching after physical activity may also help.  Certain over-the-counter medicines may also help. Ask your caregiver if there are medicines that would help you. If this is your first thoracic strain injury, proper care and proper healing time before starting activities should prevent long-term problems. Torn ligaments and tendons require as long to heal as broken bones. Average healing times may be only 1 week for a mild strain. For torn muscles  and tendons, healing time may be up to 6 weeks to 2 months. HOME CARE INSTRUCTIONS   Apply ice to the injured area. Ice massages may also be used as directed.  Put ice in a plastic bag.  Place a towel between your skin and the bag.  Leave the ice on for 15-20 minutes, 03-04 times a day, for the first 2 days.  Only take over-the-counter or prescription medicines for pain, discomfort, or fever as directed by your caregiver.  Keep your appointments for physical therapy if this was prescribed.  Use wraps and back braces as instructed. SEEK IMMEDIATE MEDICAL CARE IF:   You have an increase in bruising, swelling, or pain.  Your pain has not improved with medicines.  You develop new shortness of breath, chest pain, or fever.  Problems seem to be getting worse rather than better. MAKE SURE YOU:   Understand these instructions.  Will watch your condition.  Will get help right away if you are not doing well or get worse. Document Released: 05/21/2003 Document Revised: 05/23/2011 Document Reviewed: 04/16/2010 ExitCare Patient Information 2015 [employer]. This information is not intended to replace advice given to you by your health care provider. Make sure you discuss any questions you have with your health care provider.  
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
## 2014-12-11 LAB — URINALYSIS, ROUTINE W REFLEX MICROSCOPIC
Bilirubin Urine: NEGATIVE
GLUCOSE, UA: NEGATIVE
HGB URINE DIPSTICK: NEGATIVE
Ketones, ur: NEGATIVE
LEUKOCYTES UA: NEGATIVE
Nitrite: NEGATIVE
PH: 7 (ref 5.0–8.0)
Protein, ur: NEGATIVE
Specific Gravity, Urine: 1.018 (ref 1.001–1.035)

## 2014-12-11 LAB — URINE CULTURE
Colony Count: NO GROWTH
Organism ID, Bacteria: NO GROWTH

## 2015-02-18 ENCOUNTER — Encounter (HOSPITAL_COMMUNITY): Payer: Self-pay | Admitting: Emergency Medicine

## 2015-02-18 ENCOUNTER — Emergency Department (HOSPITAL_COMMUNITY)
Admission: EM | Admit: 2015-02-18 | Discharge: 2015-02-18 | Disposition: A | Discharge status: Home / Self Care | Payer: No Typology Code available for payment source | Attending: Emergency Medicine | Admitting: Emergency Medicine

## 2015-02-18 DIAGNOSIS — Y9389 Activity, other specified: Secondary | ICD-10-CM | POA: Diagnosis not present

## 2015-02-18 DIAGNOSIS — S4992XA Unspecified injury of left shoulder and upper arm, initial encounter: Secondary | ICD-10-CM | POA: Diagnosis present

## 2015-02-18 DIAGNOSIS — Z8659 Personal history of other mental and behavioral disorders: Secondary | ICD-10-CM | POA: Insufficient documentation

## 2015-02-18 DIAGNOSIS — Z880 Allergy status to penicillin: Secondary | ICD-10-CM | POA: Diagnosis not present

## 2015-02-18 DIAGNOSIS — Y998 Other external cause status: Secondary | ICD-10-CM | POA: Diagnosis not present

## 2015-02-18 DIAGNOSIS — Y92410 Unspecified street and highway as the place of occurrence of the external cause: Secondary | ICD-10-CM | POA: Diagnosis not present

## 2015-02-18 DIAGNOSIS — S0083XA Contusion of other part of head, initial encounter: Secondary | ICD-10-CM | POA: Diagnosis not present

## 2015-02-18 DIAGNOSIS — Z791 Long term (current) use of non-steroidal anti-inflammatories (NSAID): Secondary | ICD-10-CM | POA: Insufficient documentation

## 2015-02-18 MED ORDER — IBUPROFEN 600 MG PO TABS: 600.0000 mg | ORAL_TABLET | Freq: Four times a day (QID) | ORAL | Status: DC | PRN

## 2015-02-18 MED ORDER — METHOCARBAMOL 500 MG PO TABS: 500.0000 mg | ORAL_TABLET | Freq: Two times a day (BID) | ORAL | Status: DC

## 2015-02-18 MED ORDER — IBUPROFEN 400 MG PO TABS
800.0000 mg | ORAL_TABLET | Freq: Once | ORAL | Status: AC
  Administered 2015-02-18: 800 mg via ORAL
  Filled 2015-02-18: qty 2
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
## 2015-02-18 NOTE — Discharge Instructions (Signed)
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
## 2015-02-23 ENCOUNTER — Ambulatory Visit (INDEPENDENT_AMBULATORY_CARE_PROVIDER_SITE_OTHER): Payer: 59 | Admitting: Internal Medicine

## 2015-02-23 ENCOUNTER — Encounter: Payer: Self-pay | Admitting: Internal Medicine

## 2015-02-23 DIAGNOSIS — R51 Headache: Secondary | ICD-10-CM | POA: Diagnosis not present

## 2015-02-23 MED ORDER — MELOXICAM 15 MG PO TABS: 15.0000 mg | ORAL_TABLET | Freq: Every day | ORAL | Status: DC

## 2015-02-23 MED ORDER — METHOCARBAMOL 500 MG PO TABS: 500.0000 mg | ORAL_TABLET | Freq: Two times a day (BID) | ORAL | Status: DC
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
## 2015-02-23 NOTE — Patient Instructions (Signed)

## 2015-04-16 ENCOUNTER — Ambulatory Visit (INDEPENDENT_AMBULATORY_CARE_PROVIDER_SITE_OTHER): Payer: 59 | Admitting: Internal Medicine

## 2015-04-16 VITALS — BP 132/76 | HR 80 | Temp 98.1°F | Resp 16 | Ht 70.0 in | Wt 172.2 lb

## 2015-04-16 DIAGNOSIS — S46912A Strain of unspecified muscle, fascia and tendon at shoulder and upper arm level, left arm, initial encounter: Secondary | ICD-10-CM

## 2015-04-16 MED ORDER — PREDNISONE 20 MG PO TABS: ORAL_TABLET | ORAL | Status: DC

## 2015-04-16 NOTE — Patient Instructions (Addendum)
Rotator Cuff Injury Rotator cuff injury is any type of injury to the set of muscles and tendons that make up the stabilizing unit of your shoulder. This unit holds the ball of your upper arm bone (humerus) in the socket of your shoulder blade (scapula).  CAUSES Injuries to your rotator cuff most commonly come from sports or activities that cause your arm to be moved repeatedly over your head. Examples of this include throwing, weight lifting, swimming, or racquet sports. Long lasting (chronic) irritation of your rotator cuff can cause soreness and swelling (inflammation), bursitis, and eventual damage to your tendons, such as a tear (rupture). SIGNS AND SYMPTOMS Acute rotator cuff tear:  Sudden tearing sensation followed by severe pain shooting from your upper shoulder down your arm toward your elbow.  Decreased range of motion of your shoulder because of pain and muscle spasm.  Severe pain.  Inability to raise your arm out to the side because of pain and loss of muscle power (large tears). Chronic rotator cuff tear:  Pain that usually is worse at night and may interfere with sleep.  Gradual weakness and decreased shoulder motion as the pain worsens.  Decreased range of motion. Rotator cuff tendinitis:  Deep ache in your shoulder and the outside upper arm over your shoulder.  Pain that comes on gradually and becomes worse when lifting your arm to the side or turning it inward. DIAGNOSIS Rotator cuff injury is diagnosed through a medical history, physical exam, and imaging exam. The medical history helps determine the type of rotator cuff injury. Your health care provider will look at your injured shoulder, feel the injured area, and ask you to move your shoulder in different positions. X-ray exams typically are done to rule out other causes of shoulder pain, such as fractures. MRI is the exam of choice for the most severe shoulder injuries because the images show muscles and tendons.    TREATMENT  Chronic tear:  Medicine for pain, such as acetaminophen or ibuprofen.  Physical therapy and range-of-motion exercises may be helpful in maintaining shoulder function and strength.  Steroid injections into your shoulder joint.  Surgical repair of the rotator cuff if the injury does not heal with noninvasive treatment. Acute tear:  Anti-inflammatory medicines such as ibuprofen and naproxen to help reduce pain and swelling.  A sling to help support your arm and rest your rotator cuff muscles. Long-term use of a sling is not advised. It may cause significant stiffening of the shoulder joint.  Surgery may be considered within a few weeks, especially in younger, active people, to return the shoulder to full function.  Indications for surgical treatment include the following:  Age younger than 60 years.  Rotator cuff tears that are complete.  Physical therapy, rest, and anti-inflammatory medicines have been used for 6-8 weeks, with no improvement.  Employment or sporting activity that requires constant shoulder use. Tendinitis:  Anti-inflammatory medicines such as ibuprofen and naproxen to help reduce pain and swelling.  A sling to help support your arm and rest your rotator cuff muscles. Long-term use of a sling is not advised. It may cause significant stiffening of the shoulder joint.  Severe tendinitis may require:  Steroid injections into your shoulder joint.  Physical therapy.  Surgery. HOME CARE INSTRUCTIONS   Apply ice to your injury:  Put ice in a plastic bag.  Place a towel between your skin and the bag.  Leave the ice on for 20 minutes, 2-3 times a day.  If you   have a shoulder immobilizer (sling and straps), wear it until told otherwise by your health care provider.  You may want to sleep on several pillows or in a recliner at night to lessen swelling and pain.  Only take over-the-counter or prescription medicines for pain, discomfort, or fever as  directed by your health care provider.  Do simple hand squeezing exercises with a soft rubber ball to decrease hand swelling. SEEK MEDICAL CARE IF:   Your shoulder pain increases, or new pain or numbness develops in your arm, hand, or fingers.  Your hand or fingers are colder than your other hand. SEEK IMMEDIATE MEDICAL CARE IF:   Your arm, hand, or fingers are numb or tingling.  Your arm, hand, or fingers are increasingly swollen and painful, or they turn white or blue. MAKE SURE YOU:  Understand these instructions.  Will watch your condition.  Will get help right away if you are not doing well or get worse.

## 2015-04-17 ENCOUNTER — Encounter: Payer: Self-pay | Admitting: Internal Medicine
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
## 2015-10-15 DIAGNOSIS — H5213 Myopia, bilateral: Secondary | ICD-10-CM | POA: Diagnosis not present

## 2015-10-15 DIAGNOSIS — H52223 Regular astigmatism, bilateral: Secondary | ICD-10-CM | POA: Diagnosis not present

## 2016-07-11 ENCOUNTER — Ambulatory Visit (INDEPENDENT_AMBULATORY_CARE_PROVIDER_SITE_OTHER): Payer: 59 | Admitting: Internal Medicine

## 2016-07-11 ENCOUNTER — Encounter: Payer: Self-pay | Admitting: Internal Medicine

## 2016-07-11 VITALS — BP 110/68 | HR 56 | Temp 97.3°F | Resp 16 | Ht 71.25 in | Wt 168.4 lb

## 2016-07-11 DIAGNOSIS — F988 Other specified behavioral and emotional disorders with onset usually occurring in childhood and adolescence: Secondary | ICD-10-CM

## 2016-07-11 MED ORDER — AMPHETAMINE-DEXTROAMPHETAMINE 20 MG PO TABS: ORAL_TABLET | ORAL | 0 refills | Status: DC

## 2016-07-11 MED FILL — AMPHETAMINE SALTS 20 MG TAB: 20 | 30 days supply | Qty: 30 | Fill #0

## 2016-07-11 NOTE — Progress Notes (Signed)
  Subjective:    Patient ID: Ricky Sparks, male    DOB: [birth year], 21 y.o.   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   On no current meds.  NKA  Past Medical History:  Diagnosis Date  . ADD (attention deficit disorder) 2009   Review of Systems  10 point systems review negative except as above.    Objective:   Physical Exam  BP 110/68   Pulse (!) 56   Temp 97.3 F (36.3 C)   Resp 16   Ht 5' 11.25" (1.81 m)   Wt 168 lb 6.4 oz (76.4 kg)   BMI 23.32 kg/m   HEENT - WNL. Neck - supple. Nl Thyroid.  Chest - Clear equal BS. Cor - Nl HS. RRR w/o sig m. MS- FROM w/o deformities. Muscle power, tone and bulk Nl. Gait Nl. Neuro - Nl w/o focal abnormalities. Psyche - Mental status normal & appropriate.  No delusions, ideations or obvious mood abnormalities.    Assessment & Plan:   1. Attention deficit disorder (ADD) without hyperactivity  - amphetamine-dextroamphetamine (ADDERALL) 20 MG tablet; Take 1 to 1 & 1/2 tablet daily for ADD - Maximum 5 days / week  Dispense: 30 tablet; Refill: 0  - discussed meds & SE's. Discussed prudent diet & exercise.  - ROV 1 mon to re-evaluate.

## 2016-07-11 NOTE — Patient Instructions (Signed)

## 2016-08-12 ENCOUNTER — Ambulatory Visit (INDEPENDENT_AMBULATORY_CARE_PROVIDER_SITE_OTHER): Payer: 59 | Admitting: Internal Medicine

## 2016-08-12 DIAGNOSIS — F988 Other specified behavioral and emotional disorders with onset usually occurring in childhood and adolescence: Secondary | ICD-10-CM | POA: Diagnosis not present

## 2016-08-12 MED ORDER — AMPHETAMINE-DEXTROAMPHETAMINE 20 MG PO TABS: ORAL_TABLET | ORAL | 0 refills | Status: DC

## 2016-08-14 ENCOUNTER — Encounter: Payer: Self-pay | Admitting: Internal Medicine

## 2016-08-14 NOTE — Progress Notes (Signed)
  Subjective:    Patient ID: Ricky Sparks, male    DOB: December 15, 1995, 21 y.o.   MRN: [id number]  HPI  Patient returns for 1 month f/u back on Adderall and reports improved concentration and productivity at work. He relates that he anticipates returning to [employer] in the fall semester  Medication Sig  . amphetamine-dextroamphetamine (ADDERALL) 20 MG tablet Take 1 to 1 & 1/2 tablet daily for ADD - Maximum 5 days / week   No facility-administered medications prior to visit.    Allergies  Allergen Reactions  . Penicillins Rash   Past Medical History:  Diagnosis Date  . ADD (attention deficit disorder) 2009   Review of Systems  10 point systems review negative except as above.     Objective:   Physical Exam  BP 126/80   Pulse 68   Temp 99 F (37.2 C)   Resp 16   Ht 5' 11.25" (1.81 m)   Wt 156 lb 9.6 oz (71 kg)   BMI 21.69 kg/m   HEENT - WNL. Neck - supple.  Chest - Clear equal BS. Cor - Nl HS. RRR w/o sig MGR. PP 1(+). No edema. MS- FROM w/o deformities.  Gait Nl. Neuro -  Nl w/o focal abnormalities.    Assessment & Plan:   1. Attention deficit disorder (ADD) without hyperactivity  - amphetamine-dextroamphetamine (ADDERALL) 20 MG tablet; Take 1 to 1 & 1/2 tablet daily for ADD - Maximum 5 days / week and should last 2 months  Dispense: 60 tablet; Refill: 0  - discussed with patient to take drug holidays.

## 2016-08-16 MED FILL — DEXTROAMP-AMPHETAMIN 20 MG: 20 | 60 days supply | Qty: 60 | Fill #0

## 2016-10-18 ENCOUNTER — Ambulatory Visit: Payer: Self-pay | Admitting: Internal Medicine

## 2016-10-20 ENCOUNTER — Encounter: Payer: Self-pay | Admitting: Internal Medicine

## 2016-10-20 ENCOUNTER — Ambulatory Visit (INDEPENDENT_AMBULATORY_CARE_PROVIDER_SITE_OTHER): Payer: 59 | Admitting: Internal Medicine

## 2016-10-20 VITALS — BP 110/68 | HR 68 | Temp 97.7°F | Resp 18 | Ht 71.25 in | Wt 163.2 lb

## 2016-10-20 DIAGNOSIS — F988 Other specified behavioral and emotional disorders with onset usually occurring in childhood and adolescence: Secondary | ICD-10-CM

## 2016-10-20 DIAGNOSIS — F419 Anxiety disorder, unspecified: Secondary | ICD-10-CM | POA: Diagnosis not present

## 2016-10-20 MED ORDER — CITALOPRAM HYDROBROMIDE 20 MG PO TABS
ORAL_TABLET | ORAL | 3 refills | Status: DC
Start: 2016-10-20 — End: 2017-02-23

## 2016-10-20 MED ORDER — AMPHETAMINE-DEXTROAMPHETAMINE 20 MG PO TABS: ORAL_TABLET | ORAL | 0 refills | Status: DC

## 2016-10-20 MED FILL — CITALOPRAM HBR 20 MG TABLET: 20 | 90 days supply | Qty: 90 | Fill #0

## 2016-10-20 MED FILL — DEXTROAMP-AMPHETAMIN 20 MG: 20 | 40 days supply | Qty: 60 | Fill #0

## 2016-10-20 NOTE — Progress Notes (Signed)
  Subjective:    Patient ID: Ricky Sparks, male    DOB: 01-Dec-1995, 21 y.o.   MRN: [id number]  HPI  This nice 21 yo single WM with Dysthymia and ADD returns for f/u on Adderall for ADD still reporting improved focus, concentration and productivity. He also reports some episodes of anxiety and desires to try a med for this denies depression or SI.   Medication Sig  . amphetamine-dextroamphetamine (ADDERALL) 20 MG tablet Take 1 to 1 & 1/2 tablet daily for ADD - Maximum 5 days / week and should last 2 months   Allergies  Allergen Reactions  . Penicillins Rash   Past Medical History:  Diagnosis Date  . ADD (attention deficit disorder) 2009   Review of Systems     10 point systems review negative except as above.    Objective:   Physical Exam   BP 110/68   Pulse 68   Temp 97.7 F (36.5 C)   Resp 18   Ht 5' 11.25" (1.81 m)   Wt 163 lb 3.2 oz (74 kg)   BMI 22.60 kg/m   HEENT - WNL. Neck - supple.  Chest - Clear equal BS. Cor - Nl HS. RRR w/o sig MGR. PP 1(+). No edema. MS- FROM w/o deformities.  Gait Nl. Neuro -  Nl w/o focal abnormalities.    Assessment & Plan:   1. Attention deficit disorder (ADD) without hyperactivity  - amphetamine-dextroamphetamine (ADDERALL) 20 MG tablet; Take 1 to 1 & 1/2 tablet daily for ADD - Maximum 5 days / week and should last 2 months  Dispense: 60 tablet; Refill: 0  2. Anxiety tension state  - citalopram (CELEXA) 20 MG tablet; Take 1 tablet daily for mood & Anxiety  Dispense: 90 tablet; Refill: 3  - Discussed meds/SE's  - ROV 6 weeks

## 2016-12-01 ENCOUNTER — Ambulatory Visit: Payer: Self-pay | Admitting: Internal Medicine

## 2016-12-02 ENCOUNTER — Ambulatory Visit (INDEPENDENT_AMBULATORY_CARE_PROVIDER_SITE_OTHER): Payer: 59 | Admitting: Internal Medicine

## 2016-12-02 ENCOUNTER — Encounter: Payer: Self-pay | Admitting: Internal Medicine

## 2016-12-02 VITALS — BP 120/72 | HR 72 | Temp 98.1°F | Ht 71.25 in | Wt 151.2 lb

## 2016-12-02 DIAGNOSIS — F988 Other specified behavioral and emotional disorders with onset usually occurring in childhood and adolescence: Secondary | ICD-10-CM

## 2016-12-02 DIAGNOSIS — F419 Anxiety disorder, unspecified: Secondary | ICD-10-CM | POA: Diagnosis not present

## 2016-12-02 NOTE — Progress Notes (Signed)
  Subjective:    Patient ID: Ricky Sparks, male    DOB: 01-Feb-1996, 21 y.o.   MRN: [id number]  HPI  This very nice 21 yo single WM with ADD returns for f/u on Celexa and report a "sense of calm" and less anxiety and irritability. 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 He relates his interests in machining. Denies any apparent SE's on the Celexa. Still uses Adderall to maintain focus & concentration.   Outpatient Medications Prior to Visit  Medication Sig Dispense Refill  . citalopram (CELEXA) 20 MG tablet Take 1 tablet daily for mood & Anxiety 90 tablet 3  . amphetamine-dextroamphetamine (ADDERALL) 20 MG tablet Take 1 to 1 & 1/2 tablet daily for ADD - Maximum 5 days / week and should last 2 months 60 tablet 0   No facility-administered medications prior to visit.    Allergies  Allergen Reactions  . Penicillins Rash   Past Medical History:  Diagnosis Date  . ADD (attention deficit disorder) 2009   Review of Systems  10 point systems review negative except as above.     Objective:   Physical Exam  BP 120/72   Pulse 72   Temp 98.1 F (36.7 C)   Ht 5' 11.25" (1.81 m)   Wt 151 lb 3.2 oz (68.6 kg)   BMI 20.94 kg/m   HEENT - WNL. Neck - supple.  Chest - Clear equal BS. Cor - Nl HS. RRR w/o sig MGR. PP 1(+). No edema. MS- FROM w/o deformities.  Gait Nl. Neuro -  Nl w/o focal abnormalities.    Assessment & Plan:   1. Attention deficit disorder (ADD) without hyperactivity  - continue Celexa  2. Anxiety tension state  - refilled Adderall today  - discussed meds/SE's.   - ROV 3 months

## 2016-12-02 NOTE — Patient Instructions (Signed)

## 2016-12-07 MED FILL — DEXTROAMP-AMPHETAMIN 20 MG: 20 | 30 days supply | Qty: 60 | Fill #0

## 2017-01-16 ENCOUNTER — Other Ambulatory Visit: Payer: Self-pay | Admitting: Internal Medicine

## 2017-01-16 DIAGNOSIS — F988 Other specified behavioral and emotional disorders with onset usually occurring in childhood and adolescence: Secondary | ICD-10-CM

## 2017-01-16 MED ORDER — AMPHETAMINE-DEXTROAMPHETAMINE 20 MG PO TABS: ORAL_TABLET | ORAL | 0 refills | Status: DC

## 2017-01-19 MED FILL — DEXTROAMP-AMPHETAMIN 20 MG: 20 | 60 days supply | Qty: 60 | Fill #0

## 2017-01-24 ENCOUNTER — Emergency Department (HOSPITAL_COMMUNITY)
Admission: EM | Admit: 2017-01-24 | Discharge: 2017-01-24 | Disposition: A | Discharge status: Home / Self Care | Payer: 59 | Attending: Emergency Medicine | Admitting: Emergency Medicine

## 2017-01-24 ENCOUNTER — Other Ambulatory Visit: Payer: Self-pay

## 2017-01-24 ENCOUNTER — Emergency Department (HOSPITAL_COMMUNITY): Payer: 59

## 2017-01-24 ENCOUNTER — Encounter (HOSPITAL_COMMUNITY): Payer: Self-pay | Admitting: Emergency Medicine

## 2017-01-24 DIAGNOSIS — F909 Attention-deficit hyperactivity disorder, unspecified type: Secondary | ICD-10-CM | POA: Diagnosis not present

## 2017-01-24 DIAGNOSIS — S299XXA Unspecified injury of thorax, initial encounter: Secondary | ICD-10-CM | POA: Diagnosis present

## 2017-01-24 DIAGNOSIS — S20219A Contusion of unspecified front wall of thorax, initial encounter: Secondary | ICD-10-CM | POA: Diagnosis not present

## 2017-01-24 DIAGNOSIS — Y998 Other external cause status: Secondary | ICD-10-CM | POA: Diagnosis not present

## 2017-01-24 DIAGNOSIS — Y92410 Unspecified street and highway as the place of occurrence of the external cause: Secondary | ICD-10-CM | POA: Diagnosis not present

## 2017-01-24 DIAGNOSIS — R079 Chest pain, unspecified: Secondary | ICD-10-CM | POA: Diagnosis not present

## 2017-01-24 DIAGNOSIS — R9431 Abnormal electrocardiogram [ECG] [EKG]: Secondary | ICD-10-CM | POA: Diagnosis not present

## 2017-01-24 DIAGNOSIS — Y9389 Activity, other specified: Secondary | ICD-10-CM | POA: Insufficient documentation

## 2017-01-24 DIAGNOSIS — Z79899 Other long term (current) drug therapy: Secondary | ICD-10-CM | POA: Insufficient documentation

## 2017-01-24 LAB — I-STAT TROPONIN, ED: TROPONIN I, POC: 0 ng/mL (ref 0.00–0.08)

## 2017-01-24 MED ORDER — IBUPROFEN 800 MG PO TABS: 800.0000 mg | ORAL_TABLET | Freq: Three times a day (TID) | ORAL | 0 refills | Status: DC

## 2017-01-24 NOTE — ED Notes (Signed)
Patient transported to X-ray 
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
## 2017-01-24 NOTE — ED Notes (Signed)
Gave pt ice water, per Kendal HymenBonnie - RN.

## 2017-01-24 NOTE — Discharge Instructions (Signed)
Return if any problems. See your Physician for recheck if symptoms persist  
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
## 2017-02-23 ENCOUNTER — Ambulatory Visit (INDEPENDENT_AMBULATORY_CARE_PROVIDER_SITE_OTHER): Payer: 59 | Admitting: Internal Medicine

## 2017-02-23 DIAGNOSIS — F419 Anxiety disorder, unspecified: Secondary | ICD-10-CM

## 2017-02-23 DIAGNOSIS — F988 Other specified behavioral and emotional disorders with onset usually occurring in childhood and adolescence: Secondary | ICD-10-CM

## 2017-02-23 MED ORDER — AMPHETAMINE-DEXTROAMPHETAMINE 20 MG PO TABS: ORAL_TABLET | ORAL | 0 refills | Status: DC

## 2017-02-23 MED ORDER — CITALOPRAM HYDROBROMIDE 40 MG PO TABS: ORAL_TABLET | ORAL | 1 refills | Status: DC

## 2017-02-23 NOTE — Progress Notes (Signed)
  Subjective:    Patient ID: Ricky Sparks, male    DOB: 1995-04-09, 21 y.o.   MRN: [id number]  HPI  Patient is a nice 21 yo single WM with ADD & Dysthymia, treated with Citalopram & Adderall who currently is working at [employer] and is trying to re-apply at [employer]. He reports some improvement in his acute & chronic anxiety on Celexa, but still has difficulty with anxiety especially in social contact situations.  He reports some improvement on Adderall in organizing his thoughts, but still has issues with difficulty focusing and concentrating. He also reports some ongoing issues with difficulty falling asleep & staying asleep.  Medication Sig  . OVER THE COUNTER MEDICATION OTC Ibuprofen 200 mg PRN.  . citalopram (CELEXA) 20 MG tablet Take 1 tablet daily for mood & Anxiety  . amphetamine-dextroamphetamine (ADDERALL) 20 MG tablet Take 1 to 1 & 1/2 tablet daily for ADD - Maximum 5 days / week and should last 2 months about Dec 3rd   Allergies  Allergen Reactions  . Penicillins Rash   Review of Systems  10 point systems review negative except as above.    Objective:   Physical Exam  BP 118/80   Pulse 88   Temp 97.9 F (36.6 C)   Resp 16   Ht 5' 11.25" (1.81 m)   Wt 145 lb 3.2 oz (65.9 kg)   BMI 20.11 kg/m   HEENT - Eac's patent. TM's Nl. EOM's full. PERRLA. NasoOroPharynx clear. Neck - supple. Nl Thyroid No bruits, nodes, JVD Chest - Clear equal BS w/o Rales, rhonchi, wheezes. Cor - Nl HS. RRR w/o sig MGR. PP 1(+). No edema. MS- FROM w/o deformities. Muscle power, tone and bulk Nl. Gait Nl. Neuro - No obvious Cr N abnormalities.  Nl w/o focal abnormalities. Psyche - Mental status normal & appropriate.  Distractible.     Assessment & Plan:   1. Attention deficit disorder (ADD) without hyperactivity  - amphetamine-dextroamphetamine (ADDERALL) 20 MG tablet; Take 1 to 1 & 1/2 tablet daily for ADD - Maximum 5 days / week and should last 2 months til about Jan 24th  Dispense: 60  tablet; Refill: 0  2. Anxiety tension state  - citalopram (CELEXA) 40 MG tablet; Take 1 tablet daily for mood & Anxiety  Dispense: 90 tablet; Refill: 1  - discussed meds/SE's.  - ROV 6 weeks

## 2017-02-27 MED FILL — CITALOPRAM HBR 20 MG TABLET: 20 | 90 days supply | Qty: 90 | Fill #1

## 2017-03-13 ENCOUNTER — Telehealth: Payer: Self-pay | Admitting: *Deleted

## 2017-03-13 ENCOUNTER — Other Ambulatory Visit: Payer: Self-pay | Admitting: Internal Medicine

## 2017-03-13 NOTE — Telephone Encounter (Signed)
A message was left for the patient's mother advising that after speaking with the pharmacist, the Adderall RX is on file and they can fill it in 5 more days.

## 2017-03-15 ENCOUNTER — Other Ambulatory Visit: Payer: Self-pay | Admitting: Internal Medicine

## 2017-03-16 ENCOUNTER — Other Ambulatory Visit: Payer: Self-pay | Admitting: Internal Medicine

## 2017-03-16 DIAGNOSIS — F988 Other specified behavioral and emotional disorders with onset usually occurring in childhood and adolescence: Secondary | ICD-10-CM

## 2017-03-16 MED ORDER — AMPHETAMINE-DEXTROAMPHETAMINE 20 MG PO TABS: ORAL_TABLET | ORAL | 0 refills | Status: DC

## 2017-03-16 MED FILL — AMPHETAMINE-DEXTRO 20MG: 20 | 56 days supply | Qty: 60 | Fill #0

## 2017-03-31 ENCOUNTER — Ambulatory Visit: Payer: 59 | Admitting: Internal Medicine

## 2017-05-03 ENCOUNTER — Ambulatory Visit (INDEPENDENT_AMBULATORY_CARE_PROVIDER_SITE_OTHER): Payer: 59 | Admitting: Internal Medicine

## 2017-05-03 ENCOUNTER — Encounter: Payer: Self-pay | Admitting: Internal Medicine

## 2017-05-03 VITALS — BP 112/80 | HR 64 | Temp 97.5°F | Resp 16 | Ht 71.25 in | Wt 166.6 lb

## 2017-05-03 DIAGNOSIS — F988 Other specified behavioral and emotional disorders with onset usually occurring in childhood and adolescence: Secondary | ICD-10-CM

## 2017-05-03 DIAGNOSIS — F419 Anxiety disorder, unspecified: Secondary | ICD-10-CM | POA: Diagnosis not present

## 2017-05-03 DIAGNOSIS — F341 Dysthymic disorder: Secondary | ICD-10-CM

## 2017-05-03 MED ORDER — ESCITALOPRAM OXALATE 20 MG PO TABS: ORAL_TABLET | ORAL | 0 refills | Status: DC

## 2017-05-03 MED FILL — ESCITALOPRAM 20 MG TABLET: 20 | 90 days supply | Qty: 90 | Fill #0

## 2017-05-03 NOTE — Progress Notes (Signed)
  Subjective:    Patient ID: Ricky CheeksNicholas [name], male    DOB: 08/14/1995, 22 y.o.   MRN: [id number]  HPI  This nice 22 yo single WM with ADD & Dysthymia has been on Adderall with improvement in his ADD sx's with improved focus & concentration. He returns today for 6 week f/u after starting Celexa which he apparently stopped after a short interval for very obscure reasons. He does endorse acute and chronic anxiety and does desire some medication to help him cope with his sx's. He expresses difficulty in socialization admitting that he has very few (to no) friends,  as he says "people think I'm kinda wierd". He denies any hostility or SI, but does seem to have a low self esteem.   Medication Sig  . amphetamine-dextroamphetamine (ADDERALL) 20 MG tablet Take 1 to 1 & 1/2 tablet daily for ADD - Maximum 5 days / week and should last 2 months til about Feb 28th  . citalopram40 MG tablet Take 1 tablet daily for mood & Anxiety - Not or never took  . OTC Ibuprofen 200 mg  PRN.   Allergies  Allergen Reactions  . Penicillins Rash   Past Medical History:  Diagnosis Date  . ADD (attention deficit disorder) 2009   Review of Systems  10 point systems review negative except as above.    Objective:   Physical Exam  BP 112/80   Pulse 64   Temp (!) 97.5 F (36.4 C)   Resp 16   Ht 5' 11.25" (1.81 m)   Wt 166 lb 9.6 oz (75.6 kg)   BMI 23.07 kg/m   HEENT - WNL. Neck - supple.  Chest - Clear equal BS. Cor - Nl HS. RRR w/o sig MGR. PP 1(+). No edema. MS- FROM w/o deformities.  Gait Nl. Neuro -  Nl w/o focal abnormalities. Psyche- Flat Affect. Poor insight. No delusions/hallucinations    Assessment & Plan:   1. Attention deficit disorder (ADD) without hyperactivity  - escitalopram (LEXAPRO) 20 MG tablet; Take 1 tablet daily for anxiety, focusing & concentration  Dispense: 90 tablet; Refill: 0  2. Anxiety tension state  - escitalopram (LEXAPRO) 20 MG tablet; Take 1 tablet daily for anxiety,  focusing & concentration  Dispense: 90 tablet; Refill: 0  3. Dysthymia  - escitalopram (LEXAPRO) 20 MG tablet; Take 1 tablet daily for anxiety, focusing & concentration  Dispense: 90 tablet; Refill: 0  - discussed meds/SE's and ROV 6 weeks or prn.

## 2017-05-03 NOTE — Patient Instructions (Signed)
Persistent Depressive Disorder Persistent depressive disorder (PDD) is a mental health condition. PDD causes symptoms of low-level depression for 2 years or longer. It may also be called long-term (chronic) depression or dysthymia. PDD may include episodes of more severe depression that last for about 2 weeks (major depressive disorder or MDD). PDD can affect the way you think, feel, and sleep. This condition may also affect your relationships. You may be more likely to get sick if you have PDD. Symptoms of PDD occur for most of the day and may include:  Feeling tired (fatigue).  Low energy.  Eating too much or too little.  Sleeping too much or too little.  Feeling restless or agitated.  Feeling hopeless.  Feeling worthless or guilty.  Feeling worried or nervous (anxiety).  Trouble concentrating or making decisions.  Low self-esteem.  A negative way of looking at things (outlook).  Not being able to have fun or feel pleasure.  Avoiding interacting with people.  Getting angry or annoyed easily (irritability).  Acting aggressive or angry.  Follow these instructions at home: Activity  Go back to your normal activities as told by your doctor.  Exercise regularly as told by your doctor. General instructions  Take over-the-counter and prescription medicines only as told by your doctor.  Do not drink alcohol. Or, limit how much alcohol you drink to no more than 1 drink a day for nonpregnant women and 2 drinks a day for men. One drink equals 12 oz of beer, 5 oz of wine, or 1 oz of hard liquor. Alcohol can affect any antidepressant medicines you are taking. Talk with your doctor about your alcohol use.  Eat a healthy diet and get plenty of sleep.  Find activities that you enjoy each day.  Consider joining a support group. Your doctor may be able to suggest a support group.  Keep all follow-up visits as told by your doctor. This is important. Where to find more  information: National Alliance on Mental Illness  www.nami.org  U.S. National Institute of Mental Health  www.nimh.nih.gov  National Suicide Prevention Lifeline  1-800-273-TALK (1-800-273-8255). This is free, 24-hour help.  Contact a doctor if:  Your symptoms get worse.  You have new symptoms.  You have trouble sleeping or doing your daily activities. Get help right away if:  You self-harm.  You have serious thoughts about hurting yourself or others.  You see, hear, taste, smell, or feel things that are not there (hallucinate). This information is not intended to replace advice given to you by your health care provider. Make sure you discuss any questions you have with your health care provider. Document Released: 02/09/2015 Document Revised: 10/23/2015 Document Reviewed: 10/23/2015 Elsevier Interactive Patient Education  2017 Elsevier Inc.  

## 2017-05-10 ENCOUNTER — Other Ambulatory Visit: Payer: Self-pay | Admitting: Internal Medicine

## 2017-05-10 DIAGNOSIS — F988 Other specified behavioral and emotional disorders with onset usually occurring in childhood and adolescence: Secondary | ICD-10-CM

## 2017-05-10 MED ORDER — AMPHETAMINE-DEXTROAMPHETAMINE 20 MG PO TABS: ORAL_TABLET | ORAL | 0 refills | Status: DC

## 2017-05-11 MED FILL — AMPHETAMINE-DEXTRO 20MG: 20 | 60 days supply | Qty: 60 | Fill #0

## 2017-05-19 ENCOUNTER — Ambulatory Visit (HOSPITAL_COMMUNITY)
Admission: RE | Admit: 2017-05-19 | Discharge: 2017-05-19 | Disposition: A | Discharge status: Home / Self Care | Payer: 59 | Source: Home / Self Care | Attending: Psychiatry | Admitting: Psychiatry

## 2017-05-19 ENCOUNTER — Emergency Department (HOSPITAL_COMMUNITY)
Admission: EM | Admit: 2017-05-19 | Discharge: 2017-05-20 | Disposition: A | Discharge status: Home / Self Care | Payer: 59 | Attending: Emergency Medicine | Admitting: Emergency Medicine

## 2017-05-19 DIAGNOSIS — R45851 Suicidal ideations: Secondary | ICD-10-CM | POA: Diagnosis not present

## 2017-05-19 DIAGNOSIS — F332 Major depressive disorder, recurrent severe without psychotic features: Secondary | ICD-10-CM | POA: Diagnosis not present

## 2017-05-19 DIAGNOSIS — Z79899 Other long term (current) drug therapy: Secondary | ICD-10-CM | POA: Insufficient documentation

## 2017-05-19 DIAGNOSIS — F1910 Other psychoactive substance abuse, uncomplicated: Secondary | ICD-10-CM | POA: Diagnosis not present

## 2017-05-19 DIAGNOSIS — F322 Major depressive disorder, single episode, severe without psychotic features: Secondary | ICD-10-CM | POA: Diagnosis present

## 2017-05-19 DIAGNOSIS — F988 Other specified behavioral and emotional disorders with onset usually occurring in childhood and adolescence: Secondary | ICD-10-CM | POA: Insufficient documentation

## 2017-05-19 DIAGNOSIS — F329 Major depressive disorder, single episode, unspecified: Secondary | ICD-10-CM | POA: Insufficient documentation

## 2017-05-19 DIAGNOSIS — R450 Nervousness: Secondary | ICD-10-CM | POA: Diagnosis not present

## 2017-05-19 DIAGNOSIS — F419 Anxiety disorder, unspecified: Secondary | ICD-10-CM | POA: Diagnosis not present

## 2017-05-19 DIAGNOSIS — F339 Major depressive disorder, recurrent, unspecified: Secondary | ICD-10-CM | POA: Diagnosis not present

## 2017-05-19 DIAGNOSIS — F259 Schizoaffective disorder, unspecified: Secondary | ICD-10-CM | POA: Diagnosis present

## 2017-05-19 LAB — COMPREHENSIVE METABOLIC PANEL
ALBUMIN: 4.5 g/dL (ref 3.5–5.0)
ALT: 17 U/L (ref 17–63)
AST: 23 U/L (ref 15–41)
Alkaline Phosphatase: 58 U/L (ref 38–126)
Anion gap: 6 (ref 5–15)
BILIRUBIN TOTAL: 1.3 mg/dL — AB (ref 0.3–1.2)
BUN: 14 mg/dL (ref 6–20)
CHLORIDE: 104 mmol/L (ref 101–111)
CO2: 30 mmol/L (ref 22–32)
CREATININE: 1.1 mg/dL (ref 0.61–1.24)
Calcium: 9.8 mg/dL (ref 8.9–10.3)
GFR calc Af Amer: >60 mL/min (ref >60)
GFR calc non Af Amer: >60 mL/min (ref >60)
GLUCOSE: 114 mg/dL — AB (ref 65–99)
POTASSIUM: 4.8 mmol/L (ref 3.5–5.1)
Sodium: 140 mmol/L (ref 135–145)
Total Protein: 7.2 g/dL (ref 6.5–8.1)

## 2017-05-19 LAB — CBC WITH DIFFERENTIAL/PLATELET
BASOS ABS: 0 10*3/uL (ref 0.0–0.1)
BASOS PCT: 0 %
EOS ABS: 0 10*3/uL (ref 0.0–0.7)
EOS PCT: 0 %
HCT: 43.9 % (ref 39.0–52.0)
Hemoglobin: 16 g/dL (ref 13.0–17.0)
Lymphocytes Relative: 18 %
Lymphs Abs: 1.3 10*3/uL (ref 0.7–4.0)
MCH: 31.3 pg (ref 26.0–34.0)
MCHC: 36.4 g/dL — ABNORMAL HIGH (ref 30.0–36.0)
MCV: 85.7 fL (ref 78.0–100.0)
Monocytes Absolute: 0.4 10*3/uL (ref 0.1–1.0)
Monocytes Relative: 5 %
Neutro Abs: 5.8 10*3/uL (ref 1.7–7.7)
Neutrophils Relative %: 77 %
PLATELETS: 203 10*3/uL (ref 150–400)
RBC: 5.12 MIL/uL (ref 4.22–5.81)
RDW: 12.4 % (ref 11.5–15.5)
WBC: 7.5 10*3/uL (ref 4.0–10.5)

## 2017-05-19 LAB — RAPID URINE DRUG SCREEN, HOSP PERFORMED
AMPHETAMINES: NOT DETECTED
BARBITURATES: NOT DETECTED
BENZODIAZEPINES: NOT DETECTED
Cocaine: NOT DETECTED
Opiates: NOT DETECTED
Tetrahydrocannabinol: NOT DETECTED

## 2017-05-19 LAB — TSH: TSH: 1.03 u[IU]/mL (ref 0.350–4.500)

## 2017-05-19 LAB — ETHANOL: Alcohol, Ethyl (B): <10 mg/dL (ref <10)

## 2017-05-19 MED ORDER — AMPHETAMINE-DEXTROAMPHETAMINE 20 MG PO TABS
20.0000 mg | ORAL_TABLET | Freq: Every day | ORAL | Status: DC
  Administered 2017-05-19: 20 mg via ORAL
  Filled 2017-05-19: qty 1

## 2017-05-19 MED ORDER — TRAZODONE HCL 50 MG PO TABS
50.0000 mg | ORAL_TABLET | Freq: Every day | ORAL | Status: DC
  Administered 2017-05-19: 50 mg via ORAL
  Filled 2017-05-19: qty 1

## 2017-05-19 NOTE — ED Notes (Signed)
Belongings: gray hoodie, black shirt, black jeans, black socks, black boots; glasses.
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
## 2017-05-19 NOTE — ED Notes (Signed)
Affect blunted, mood depressed, behavior appropriate. Denies SI/HI/AVH, but thinks that he might be delusional. He did not want to talk about it.

## 2017-05-19 NOTE — ED Triage Notes (Signed)
Transported by [employer] from BH--(IVC) "22 year old male, presents with mother. Yesterday sent text to mother stating he wanted to die and end it. He has also been engaging in self injurious behaviors (burning himself)."   When this writer asked patient his reason for coming to ED he responded "Oh I just need a physical, just need to get checked out." Currently denies any SI/HI. Cooperative, [employer] with patient at bedside.

## 2017-05-19 NOTE — ED Notes (Signed)
[facility] Expected date:  Expected time:  Means of arrival:  Comments: HALL B

## 2017-05-19 NOTE — ED Notes (Signed)
ED Provider at bedside. 

## 2017-05-19 NOTE — ED Notes (Signed)
Pt A&O x 3, no distress, Watching TV at present.  Monitoring for safety, Q 15 min checks in effect.

## 2017-05-19 NOTE — ED Provider Notes (Signed)
[name]-EMERGENCY DEPT Provider Note   CSN: [id number] Arrival date & time: 05/19/17  1627     History   Chief Complaint Chief Complaint  Patient presents with  . Psychiatric Evaluation    HPI Ricky Sparks is a 22 y.o. male.  HPI Patient brought in under IVC by psychiatry.  Was a walk-in to behavioral health with his mother.  Has been sending some suicidal contacts.  States he has been depressed.  Denies being suicidal however.  IVC paperwork states he has been burning himself.  Patient states he last years ago.  But states he does have a history of self-harm.  States he will drink alcohol.  Will finish the fifth and around 3 or 4 days.  Denies other drug abuse.  Is on Adderall for ADHD through his primary care doctor.  Had been started on Lexapro but states it did not work for him and he is not on it.  Denies hallucinations. Past Medical History:  Diagnosis Date  . ADD (attention deficit disorder) 2009    Patient Active Problem List   Diagnosis Date Noted  . Anxiety tension state 05/03/2017  . Dysthymia 08/26/2013  . ADD (attention deficit disorder) 05/01/2013    No past surgical history on file.     Home Medications    Prior to Admission medications   Medication Sig Start Date End Date Taking? Authorizing Provider  amphetamine-dextroamphetamine (ADDERALL) 20 MG tablet Take 1 to 1 & 1/2 tablet daily for ADD - Maximum 5 days / week and should last 2 months til about April  24th 05/10/17 07/05/17  Lucky CowboyMcKeown, William, MD  escitalopram (LEXAPRO) 20 MG tablet Take 1 tablet daily for anxiety, focusing & concentration 05/03/17   Lucky CowboyMcKeown, William, MD    Family History No family history on file.  Social History Social History   Tobacco Use  . Smoking status: Never Smoker  . Smokeless tobacco: Never Used  Substance Use Topics  . Alcohol use: Yes  . Drug use: No     Allergies   Penicillins   Review of Systems Review of Systems    Constitutional: Negative for appetite change.  HENT: Negative for congestion.   Respiratory: Negative for cough.   Gastrointestinal: Negative for abdominal pain.  Genitourinary: Negative for flank pain.  Musculoskeletal: Negative for back pain.  Neurological: Negative for light-headedness.  Hematological: Negative for adenopathy.  Psychiatric/Behavioral: Positive for self-injury and suicidal ideas.     Physical Exam Updated Vital Signs BP (!) 152/105 (BP Location: Right Arm)   Pulse 94   Temp 98.2 F (36.8 C) (Oral)   Resp 18   SpO2 100%   Physical Exam  Constitutional: He is oriented to person, place, and time. He appears well-developed.  HENT:  Head: Normocephalic.  Eyes: Pupils are equal, round, and reactive to light.  Neck: Neck supple.  Cardiovascular: Normal rate.  Pulmonary/Chest: Effort normal.  Abdominal: Soft.  Neurological: He is alert and oriented to person, place, and time.  Skin: Skin is warm.  Psychiatric: He has a normal mood and affect.     ED Treatments / Results  Labs (all labs ordered are listed, but only abnormal results are displayed) Labs Reviewed  COMPREHENSIVE METABOLIC PANEL  ETHANOL  RAPID URINE DRUG SCREEN, HOSP PERFORMED  CBC WITH DIFFERENTIAL/PLATELET  TSH    EKG  EKG Interpretation None       Radiology No results found.  Procedures Procedures (including critical care time)  Medications Ordered in ED  Medications  amphetamine-dextroamphetamine (ADDERALL) tablet 20 mg (20 mg Oral Given 05/19/17 1652)     Initial Impression / Assessment and Plan / ED Course  I have reviewed the triage vital signs and the nursing notes.  Pertinent labs & imaging results that were available during my care of the patient were reviewed by me and considered in my medical decision making (see chart for details).     Patient presents suicidal.  IVC by psychiatry.  Sent for medical clearance.  Lab work pending the patient is medically cleared  at this time.  Final Clinical Impressions(s) / ED Diagnoses   Final diagnoses:  Suicidal ideation    ED Discharge Orders    None       Benjiman Core, MD 05/19/17 1654
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
## 2017-05-20 ENCOUNTER — Encounter (HOSPITAL_COMMUNITY): Payer: Self-pay

## 2017-05-20 ENCOUNTER — Inpatient Hospital Stay (HOSPITAL_COMMUNITY)
Admission: AD | Admit: 2017-05-20 | Discharge: 2017-05-25 | DRG: 885 | Disposition: A | Discharge status: Home / Self Care | Payer: 59 | Attending: Psychiatry | Admitting: Psychiatry

## 2017-05-20 ENCOUNTER — Other Ambulatory Visit: Payer: Self-pay

## 2017-05-20 DIAGNOSIS — F419 Anxiety disorder, unspecified: Secondary | ICD-10-CM

## 2017-05-20 DIAGNOSIS — R45851 Suicidal ideations: Secondary | ICD-10-CM | POA: Diagnosis not present

## 2017-05-20 DIAGNOSIS — F323 Major depressive disorder, single episode, severe with psychotic features: Secondary | ICD-10-CM | POA: Diagnosis present

## 2017-05-20 DIAGNOSIS — G4700 Insomnia, unspecified: Secondary | ICD-10-CM | POA: Diagnosis present

## 2017-05-20 DIAGNOSIS — T43595A Adverse effect of other antipsychotics and neuroleptics, initial encounter: Secondary | ICD-10-CM | POA: Diagnosis not present

## 2017-05-20 DIAGNOSIS — F329 Major depressive disorder, single episode, unspecified: Secondary | ICD-10-CM

## 2017-05-20 DIAGNOSIS — F332 Major depressive disorder, recurrent severe without psychotic features: Secondary | ICD-10-CM

## 2017-05-20 DIAGNOSIS — F988 Other specified behavioral and emotional disorders with onset usually occurring in childhood and adolescence: Secondary | ICD-10-CM | POA: Diagnosis present

## 2017-05-20 DIAGNOSIS — F1910 Other psychoactive substance abuse, uncomplicated: Secondary | ICD-10-CM

## 2017-05-20 DIAGNOSIS — Z79899 Other long term (current) drug therapy: Secondary | ICD-10-CM | POA: Diagnosis not present

## 2017-05-20 DIAGNOSIS — F29 Unspecified psychosis not due to a substance or known physiological condition: Secondary | ICD-10-CM | POA: Diagnosis not present

## 2017-05-20 DIAGNOSIS — G2571 Drug induced akathisia: Secondary | ICD-10-CM | POA: Diagnosis not present

## 2017-05-20 DIAGNOSIS — F259 Schizoaffective disorder, unspecified: Secondary | ICD-10-CM | POA: Diagnosis present

## 2017-05-20 DIAGNOSIS — F322 Major depressive disorder, single episode, severe without psychotic features: Secondary | ICD-10-CM | POA: Diagnosis present

## 2017-05-20 DIAGNOSIS — Z915 Personal history of self-harm: Secondary | ICD-10-CM | POA: Diagnosis not present

## 2017-05-20 DIAGNOSIS — F909 Attention-deficit hyperactivity disorder, unspecified type: Secondary | ICD-10-CM | POA: Diagnosis present

## 2017-05-20 DIAGNOSIS — Z880 Allergy status to penicillin: Secondary | ICD-10-CM | POA: Diagnosis not present

## 2017-05-20 DIAGNOSIS — F331 Major depressive disorder, recurrent, moderate: Secondary | ICD-10-CM | POA: Diagnosis not present

## 2017-05-20 DIAGNOSIS — F1290 Cannabis use, unspecified, uncomplicated: Secondary | ICD-10-CM | POA: Diagnosis present

## 2017-05-20 DIAGNOSIS — R450 Nervousness: Secondary | ICD-10-CM | POA: Diagnosis not present

## 2017-05-20 DIAGNOSIS — F1994 Other psychoactive substance use, unspecified with psychoactive substance-induced mood disorder: Secondary | ICD-10-CM | POA: Diagnosis not present

## 2017-05-20 MED ORDER — MAGNESIUM HYDROXIDE 400 MG/5ML PO SUSP: 30.0000 mL | Freq: Every day | ORAL | Status: DC | PRN

## 2017-05-20 MED ORDER — NICOTINE POLACRILEX 2 MG MT GUM
2.0000 mg | CHEWING_GUM | OROMUCOSAL | Status: DC | PRN
  Filled 2017-05-20: qty 1

## 2017-05-20 MED ORDER — FLUOXETINE HCL 10 MG PO CAPS: 10.0000 mg | ORAL_CAPSULE | Freq: Every day | ORAL | Status: DC

## 2017-05-20 MED ORDER — LORAZEPAM 1 MG PO TABS
1.0000 mg | ORAL_TABLET | Freq: Once | ORAL | Status: AC
  Administered 2017-05-20: 1 mg via ORAL
  Filled 2017-05-20: qty 1

## 2017-05-20 MED ORDER — NICOTINE POLACRILEX 2 MG MT GUM
2.0000 mg | CHEWING_GUM | OROMUCOSAL | Status: DC | PRN
  Administered 2017-05-22 – 2017-05-25 (×7): 2 mg via ORAL
  Filled 2017-05-20: qty 1

## 2017-05-20 MED ORDER — FLUOXETINE HCL 10 MG PO CAPS
10.0000 mg | ORAL_CAPSULE | Freq: Every day | ORAL | Status: DC
  Administered 2017-05-21: 10 mg via ORAL
  Filled 2017-05-20 (×3): qty 1

## 2017-05-20 MED ORDER — HYDROXYZINE HCL 25 MG PO TABS
50.0000 mg | ORAL_TABLET | Freq: Three times a day (TID) | ORAL | Status: DC | PRN
  Administered 2017-05-20: 50 mg via ORAL
  Filled 2017-05-20: qty 2

## 2017-05-20 MED ORDER — TRAZODONE HCL 50 MG PO TABS
50.0000 mg | ORAL_TABLET | Freq: Every day | ORAL | Status: DC
  Administered 2017-05-20: 50 mg via ORAL
  Filled 2017-05-20 (×5): qty 1

## 2017-05-20 MED ORDER — ALUM & MAG HYDROXIDE-SIMETH 200-200-20 MG/5ML PO SUSP: 30.0000 mL | ORAL | Status: DC | PRN

## 2017-05-20 MED ORDER — HYDROXYZINE HCL 50 MG PO TABS
50.0000 mg | ORAL_TABLET | Freq: Three times a day (TID) | ORAL | Status: DC | PRN
  Administered 2017-05-20 – 2017-05-22 (×4): 50 mg via ORAL
  Filled 2017-05-20 (×5): qty 1

## 2017-05-20 MED ORDER — ACETAMINOPHEN 325 MG PO TABS: 650.0000 mg | ORAL_TABLET | Freq: Four times a day (QID) | ORAL | Status: DC | PRN

## 2017-05-20 NOTE — ED Notes (Signed)
Sheriff called for transport  

## 2017-05-20 NOTE — Tx Team (Signed)
Initial Treatment Plan 05/20/2017 6:25 PM Ricky Cheeksicholas [name] [id number]    PATIENT STRESSORS: Financial difficulties Occupational concerns   PATIENT STRENGTHS: General fund of knowledge Physical Health Supportive family/friends   PATIENT IDENTIFIED PROBLEMS: Depression  Psychosis  "Get a better balance on my moods"  "Not be so spastic"  "Not be so down"             DISCHARGE CRITERIA:  Improved stabilization in mood, thinking, and/or behavior Verbal commitment to aftercare and medication compliance  PRELIMINARY DISCHARGE PLAN: Outpatient therapy Medication management  PATIENT/FAMILY INVOLVEMENT: This treatment plan has been presented to and reviewed with the patient, Ricky Sparks.  The patient and family have been given the opportunity to ask questions and make suggestions.  Levin BaconHeather V Damean Poffenberger, RN 05/20/2017, 6:25 PM

## 2017-05-20 NOTE — ED Notes (Signed)
Patient talking with Peer Support. 
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
## 2017-05-20 NOTE — ED Notes (Signed)
Patient visiting with his Mom.
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
## 2017-05-20 NOTE — ED Notes (Signed)
[facility].
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
## 2017-05-20 NOTE — ED Notes (Signed)
Patient refusing the prozac.  Wanted his adderall.

## 2017-05-21 DIAGNOSIS — F29 Unspecified psychosis not due to a substance or known physiological condition: Secondary | ICD-10-CM

## 2017-05-21 MED ORDER — LORAZEPAM 1 MG PO TABS
ORAL_TABLET | ORAL | Status: AC
  Filled 2017-05-21: qty 1

## 2017-05-21 MED ORDER — ARIPIPRAZOLE 10 MG PO TABS
10.0000 mg | ORAL_TABLET | Freq: Every day | ORAL | Status: DC
  Administered 2017-05-21: 10 mg via ORAL
  Filled 2017-05-21 (×5): qty 1

## 2017-05-21 MED ORDER — LORAZEPAM 1 MG PO TABS
1.0000 mg | ORAL_TABLET | Freq: Once | ORAL | Status: AC
  Administered 2017-05-21: 1 mg via ORAL

## 2017-05-21 NOTE — B[facility] Group Notes (Signed)
Pt did not attend wrap up group this evening. Pt laid in bed instead.  

## 2017-05-21 NOTE — B[facility] Group Notes (Signed)
Orientation / Goals   Date:  05/21/2017  Time:  11:35 AM  Type of Therapy:  Nurse Education  /  The group focuses on teaching patients who their staff is, what the staff's responsibiliiteis are and what the unit programming / scheduling looks like.   Participation Level:  Active  Participation Quality:  Attentive  Affect:  Appropriate  Cognitive:  Alert  Insight:  Good  Engagement in Group:  Engaged  Modes of Intervention:  Education  Summary of Progress/Problems:  Rich BraveDuke, Linde Wilensky Lynn 05/21/2017, 11:35 AM
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
## 2017-05-21 NOTE — B[facility] Group Notes (Signed)
[facility] Group Notes: (Clinical Social Work)   05/21/2017      Type of Therapy:  Group Therapy   Participation Level:  Did Not Attend despite MHT prompting   Samarie Pinder Grossman-Orr, LCSW 05/21/2017, 11:38 AM     

## 2017-05-21 NOTE — Progress Notes (Signed)
D: Pt denies SI/HI/AVH. Pt is pleasant and cooperative. Pt think he's withdrawing from Adderall. Pt said he was feeling better.   A: Pt was offered support and encouragement. Pt was given scheduled medications. Pt was encourage to attend groups. Q 15 minute checks were done for safety.   R:Pt attends groups and interacts well with peers and staff. Pt is taking medication. Pt has no complaints at this time .Pt receptive to treatment and safety maintained on unit.
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
## 2017-05-21 NOTE — Progress Notes (Signed)
Pt presents with a flat affect and anxious mood. Pt noted to be fidgety and pacing. Pt verbalized hx of AH but denies any active hallucinations during assessment. Pt took Prozac this morning without any side effects. Pt was then started on Abilify and c/o of feeling restless and increasingly anxious to the point he was unable to tolerate it. Pt was given a one time order of Ativan for discomfort per Dr. Jama Flavorsobos. Medications reviewed with pt. Verbal support provided. Pt encouraged to attend groups as tolerated. 15 minute checks performed for safety.

## 2017-05-21 NOTE — [facility] Suicide Risk Assessment (Signed)
[facility]   Nursing information obtained from:  Patient Demographic factors:  Male, Caucasian, Living alone, Unemployed Current Mental Status:  NA Loss Factors:  Financial problems / change in socioeconomic status, Decrease in vocational status Historical Factors:  Family history of mental illness or substance abuse Risk Reduction Factors:  NA  Total Time spent with patient: 45 minutes Principal Problem: Schizoaffective disorder (HCC) Diagnosis:   Patient Active Problem List   Diagnosis Date Noted  . Schizoaffective disorder (HCC) [F25.9] 05/20/2017  . Anxiety tension state [F41.9] 05/03/2017  . Dysthymia [F34.1] 08/26/2013  . ADD (attention deficit disorder) [F98.8] 05/01/2013     Continued Clinical Symptoms:  Alcohol Use Disorder Identification Test Final Score (AUDIT): 3 The "Alcohol Use Disorders Identification Test", Guidelines for Use in Primary Care, Second Edition.  World Science writerHealth Organization [facility]WHO). Score between 0-7:  no or low risk or alcohol related problems. Score between 8-15:  moderate risk of alcohol related problems. Score between 16-19:  high risk of alcohol related problems. Score 20 or above:  warrants further diagnostic evaluation for alcohol dependence and treatment.   CLINICAL FACTORS:  22 year old single male, no children, lives alone, unemployed .   Patient presented to hospital with his mother after having sent her concerning text messages stating that he wanted to end his pain. He has also been engaging in self injurious behaviors such as burning self, reportedly to " see if he is real or fake". He reports frequent feelings of deja vu and that he has somehow been in the same situation before. Reports occasional auditory hallucinations, described as " murmurs", " music", " radio chatter", particularly when he is feeling tired .  At present patient presents as vague historian, minimizes depression and attributes symptoms to partly  to " boredom", but does endorse neuro-vegetative symptoms including decreased motivation, poor sleep, decreased sense of self esteem. At present denies suicidal ideations.   No prior psychiatric admissions, denies suicidal attempts, but endorses history of self burning x 2 years . He does report history of depression and of mood swings/instability. States " I feel my moods are up and down a lot", but does not endorse any clear history of mania. Denies history of violence . He was being prescribed Adderall by PCP, and was also prescribed Lexapro x 2-3 weeks prior to admission, states he does not feel Lexapro was working .   Denies drug or alcohol abuse . Denies abusing prescribed Adderall. Of note, admission UDS negative, admission BAL <10  Denies medical illnesses .   Dx- Psychosis, Unspecified / consider Bipolar Spectrum Disorder   Plan- Started on Abilify 10 mgrs QDAY . Vistaril PRNs for anxiety as needed    Musculoskeletal: Strength & Muscle Tone: within normal limits Gait & Station: normal Patient leans: N/A  Psychiatric Specialty Exam: Physical Exam  ROS denies headache, no chest pain, no shortness of breath, no vomiting , no nausea, no diarrhea, no rash   Blood pressure (!) 140/98, pulse 97, temperature 98.4 F (36.9 C), temperature source Oral, resp. rate 18, height 6' (1.829 m), weight 68 kg (150 lb).Body mass index is 20.34 kg/m.  General Appearance:  Fairly groomed   Eye Contact:  Fair  Speech:  Normal Rate  Volume:  Normal  Mood:  Anxious and and vaguely depressed   Affect:  vaguely anxious and irritable   Thought Process:  Linear and Descriptions of Associations: Intact  Orientation:  Other:  fully alert and attentive  Thought Content:  reports episodic hallucinations, currently not internally preoccupied, no delusions expressed at this time   Suicidal Thoughts:  No denies any suicidal or self injurious ideations, denies any homicidal ideations, contracts for safety on  unit   Homicidal Thoughts:  No  Memory:  recent and remote grossly intact   Judgement:  Fair  Insight:  Fair  Psychomotor Activity:  mildly restless, pacing at times  Concentration:  Concentration: Fair and Attention Span: Fair  Recall:  Good  Fund of Knowledge:  Good  Language:  Good  Akathisia:  Negative  Handed:  Right  AIMS (if indicated):     Assets:  Desire for Improvement Resilience  ADL's:  Intact  Cognition:  WNL  Sleep:  Number of Hours: 6(time change affected)      COGNITIVE FEATURES THAT CONTRIBUTE TO RISK:  Closed-mindedness and Loss of executive function    SUICIDE RISK:   Moderate:  Frequent suicidal ideation with limited intensity, and duration, some specificity in terms of plans, no associated intent, good self-control, limited dysphoria/symptomatology, some risk factors present, and identifiable protective factors, including available and accessible social support.  PLAN OF CARE: Patient will be admitted to inpatient psychiatric unit for stabilization and safety. Will provide and encourage milieu participation. Provide medication management and maked adjustments as needed.  Will follow daily.    I certify that inpatient services furnished can reasonably be expected to improve the patient's condition.   Craige Cotta, MD 05/21/2017, 12:51 PM

## 2017-05-22 MED ORDER — LURASIDONE HCL 20 MG PO TABS
20.0000 mg | ORAL_TABLET | Freq: Every day | ORAL | Status: DC
  Administered 2017-05-22 – 2017-05-25 (×4): 20 mg via ORAL
  Filled 2017-05-22 (×7): qty 1

## 2017-05-22 MED ORDER — HYDROXYZINE HCL 25 MG PO TABS
25.0000 mg | ORAL_TABLET | Freq: Three times a day (TID) | ORAL | Status: DC | PRN
  Administered 2017-05-22: 25 mg via ORAL
  Filled 2017-05-22: qty 1

## 2017-05-22 MED ORDER — TRAZODONE HCL 50 MG PO TABS
50.0000 mg | ORAL_TABLET | Freq: Every evening | ORAL | Status: DC | PRN
  Administered 2017-05-22 – 2017-05-24 (×3): 50 mg via ORAL
  Filled 2017-05-22 (×2): qty 1

## 2017-05-22 MED ORDER — LORAZEPAM 0.5 MG PO TABS: 0.5000 mg | ORAL_TABLET | Freq: Four times a day (QID) | ORAL | Status: DC | PRN

## 2017-05-22 MED ORDER — ATOMOXETINE HCL 18 MG PO CAPS
18.0000 mg | ORAL_CAPSULE | Freq: Every day | ORAL | Status: DC
  Administered 2017-05-22 – 2017-05-25 (×4): 18 mg via ORAL
  Filled 2017-05-22 (×8): qty 1

## 2017-05-22 NOTE — Progress Notes (Signed)
D: Pt denies SI/HI/AVH. Pt is pleasant and cooperative. Pt stated he was feeling better , especially after getting Strattera and Latuda.  A: Pt was offered support and encouragement. Pt was given scheduled medications. Pt was encourage to attend groups. Q 15 minute checks were done for safety.   R:Pt attends groups and interacts well with peers and staff. Pt is taking medication. Pt has no complaints.Pt receptive to treatment and safety maintained on unit.

## 2017-05-22 NOTE — [facility] Suicide Risk Assessment (Signed)
[facility] INPATIENT:  Family/Significant Other Suicide Prevention Education  Suicide Prevention Education:  Contact Attempts: Cyril LoosenJennifer Tafolla, mother, [phone], has been identified by the patient as the family member/significant other with whom the patient will be residing, and identified as the person(s) who will aid the patient in the event of a mental health crisis.  With written consent from the patient, two attempts were made to provide suicide prevention education, prior to and/or following the patient's discharge.  We were unsuccessful in providing suicide prevention education.  A suicide education pamphlet was given to the patient to share with family/significant other.  Date and time of first attempt:05/22/17, 1525 Date and time of second attempt:  [name], Chaka Jefferys Jon, LCSW 05/22/2017, 3:26 PM

## 2017-05-22 NOTE — Progress Notes (Signed)
Recreation Therapy Notes   Date:3.11.19 Time:9:30 a.m. Location: [address] Dayroom  Group Topic: Stress Management  Goal Area(s) Addresses:  Goal 1.1: To reduce stress  -Patient will report feeling a reduction in stress level  -Patient will identify the importance of stress management  -Patient will participate during stress management group treatment    Behavioral Response:Patient did not attend   [facility], Recreation Therapy Intern   Lonn Im 05/22/2017 11:17 AM 

## 2017-05-22 NOTE — Tx Team (Signed)
Interdisciplinary Treatment and Diagnostic Plan Update  05/22/2017 Time of Session: 10:50am  [name] MRN: [id number]  Principal Diagnosis: Psychosis [facility]  Secondary Diagnoses: Principal Problem:   Psychosis (Ricky Sparks)   Current Medications:  Current Facility-Administered Medications  Medication Dose Route Frequency Provider Last Rate Last Dose  . acetaminophen (TYLENOL) tablet 650 mg  650 mg Oral Q6H PRN Patrecia Pour, NP      . alum & mag hydroxide-simeth (MAALOX/MYLANTA) 200-200-20 MG/5ML suspension 30 mL  30 mL Oral Q4H PRN Patrecia Pour, NP      . ARIPiprazole (ABILIFY) tablet 10 mg  10 mg Oral Daily Money, Lowry Ram, FNP   Stopped at 05/22/17 0800  . hydrOXYzine (ATARAX/VISTARIL) tablet 50 mg  50 mg Oral TID PRN Patrecia Pour, NP   50 mg at 05/22/17 0741  . magnesium hydroxide (MILK OF MAGNESIA) suspension 30 mL  30 mL Oral Daily PRN Patrecia Pour, NP      . nicotine polacrilex (NICORETTE) gum 2 mg  2 mg Oral PRN Patrecia Pour, NP      . traZODone (DESYREL) tablet 50 mg  50 mg Oral QHS Patrecia Pour, NP   50 mg at 05/20/17 2110   PTA Medications: Medications Prior to Admission  Medication Sig Dispense Refill Last Dose  . escitalopram (LEXAPRO) 20 MG tablet Take 1 tablet daily for anxiety, focusing & concentration 90 tablet 0 05/18/2017 at Unknown time    Patient Stressors: Financial difficulties Occupational concerns  Patient Strengths: General fund of knowledge Physical Health Supportive family/friends  Treatment Modalities: Medication Management, Group therapy, Case management,  1 to 1 session with clinician, Psychoeducation, Recreational therapy.   Physician Treatment Plan for Primary Diagnosis: Psychosis ([name]) Long Term Goal(s): Improvement in symptoms so as ready for discharge Improvement in symptoms so as ready for discharge   Short Term Goals: Ability to verbalize feelings will improve Compliance with prescribed medications will improve Ability  to identify changes in lifestyle to reduce recurrence of condition will improve Ability to demonstrate self-control will improve Ability to identify and develop effective coping behaviors will improve  Medication Management: Evaluate patient's response, side effects, and tolerance of medication regimen.  Therapeutic Interventions: 1 to 1 sessions, Unit Group sessions and Medication administration.  Evaluation of Outcomes: Not Met  Physician Treatment Plan for Secondary Diagnosis: Principal Problem:   Psychosis (Ricky Sparks)  Long Term Goal(s): Improvement in symptoms so as ready for discharge Improvement in symptoms so as ready for discharge   Short Term Goals: Ability to verbalize feelings will improve Compliance with prescribed medications will improve Ability to identify changes in lifestyle to reduce recurrence of condition will improve Ability to demonstrate self-control will improve Ability to identify and develop effective coping behaviors will improve     Medication Management: Evaluate patient's response, side effects, and tolerance of medication regimen.  Therapeutic Interventions: 1 to 1 sessions, Unit Group sessions and Medication administration.  Evaluation of Outcomes: Not Met   RN Treatment Plan for Primary Diagnosis: Psychosis (Ricky Sparks) Long Term Goal(s): Knowledge of disease and therapeutic regimen to maintain health will improve  Short Term Goals: Ability to remain free from injury will improve, Ability to verbalize frustration and anger appropriately will improve, Ability to demonstrate self-control, Ability to participate in decision making will improve, Ability to verbalize feelings will improve, Ability to disclose and discuss suicidal ideas, Ability to identify and develop effective coping behaviors will improve and Compliance with prescribed medications will improve  Medication Management:  RN will administer medications as ordered by provider, will assess and evaluate  patient's response and provide education to patient for prescribed medication. RN will report any adverse and/or side effects to prescribing provider.  Therapeutic Interventions: 1 on 1 counseling sessions, Psychoeducation, Medication administration, Evaluate responses to treatment, Monitor vital signs and CBGs as ordered, Perform/monitor CIWA, COWS, AIMS and Fall Risk screenings as ordered, Perform wound care treatments as ordered.  Evaluation of Outcomes: Not Met   LCSW Treatment Plan for Primary Diagnosis: Psychosis (Ricky Sparks) Long Term Goal(s): Safe transition to appropriate next level of care at discharge, Engage patient in therapeutic group addressing interpersonal concerns.  Short Term Goals: Engage patient in aftercare planning with referrals and resources, Increase social support, Increase ability to appropriately verbalize feelings, Increase emotional regulation, Facilitate acceptance of mental health diagnosis and concerns, Facilitate patient progression through stages of change regarding substance use diagnoses and concerns, Identify triggers associated with mental health/substance abuse issues and Increase skills for wellness and recovery  Therapeutic Interventions: Assess for all discharge needs, 1 to 1 time with Social worker, Explore available resources and support systems, Assess for adequacy in community support network, Educate family and significant other(s) on suicide prevention, Complete Psychosocial Assessment, Interpersonal group therapy.  Evaluation of Outcomes: Not Met   Progress in Treatment: Attending groups: No. Participating in groups: No. Taking medication as prescribed: Yes. Toleration medication: Yes. Family/Significant other contact made: No, will contact:  the patient's mother, Ricky Sparks Patient understands diagnosis: Yes. Discussing patient identified problems/goals with staff: Yes. Medical problems stabilized or resolved: Yes. Denies suicidal/homicidal  ideation: Yes. Issues/concerns per patient self-inventory: No. Other:   New problem(s) identified: None  New Short Term/Long Term Goal(s):medication stabilization, elimination of SI thoughts, development of comprehensive mental wellness plan.   Patient Goals: "I just want to be better balanced"  Discharge Plan or Barriers: Return home and follow up   Reason for Continuation of Hospitalization: Anxiety Depression Medication stabilization  Estimated Length of Stay: Friday, 05/1517  Attendees: Patient: Ricky Sparks 05/22/2017 8:24 AM  Physician: Dr. Neita Garnet  05/22/2017 8:24 AM  Nursing: Mary Sella, RN 05/22/2017 8:24 AM  RN Care Manager: 05/22/2017 8:24 AM  Social Worker: Radonna Ricker, [place] 05/22/2017 8:24 AM  Recreational Therapist:  05/22/2017 8:24 AM  Other:  05/22/2017 8:24 AM  Other:  05/22/2017 8:24 AM  Other: 05/22/2017 8:24 AM    Scribe for Treatment Team: Marylee Floras, [place] 05/22/2017 8:24 AM

## 2017-05-22 NOTE — [facility] Group Notes (Signed)
[facility] LCSW Group Therapy Note  Date/Time: 05/22/17, 1315  Type of Therapy and Topic:  Group Therapy:  Overcoming Obstacles  Participation Level:  active  Description of Group:    In this group patients will be encouraged to explore what they see as obstacles to their own wellness and recovery. They will be guided to discuss their thoughts, feelings, and behaviors related to these obstacles. The group will process together ways to cope with barriers, with attention given to specific choices patients can make. Each patient will be challenged to identify changes they are motivated to make in order to overcome their obstacles. This group will be process-oriented, with patients participating in exploration of their own experiences as well as giving and receiving support and challenge from other group members.  Therapeutic Goals: 1. Patient will identify personal and current obstacles as they relate to admission. 2. Patient will identify barriers that currently interfere with their wellness or overcoming obstacles.  3. Patient will identify feelings, thought process and behaviors related to these barriers. 4. Patient will identify two changes they are willing to make to overcome these obstacles:    Summary of Patient Progress: Pt identified isolation as his biggest obstacle and also connected it to his depression.  Pt was active in talking about ways to overcome these obstacles during group discussion.      Therapeutic Modalities:   Cognitive Behavioral Therapy Solution Focused Therapy Motivational Interviewing Relapse Prevention Therapy  Daleen SquibbGreg Ariane Ditullio, LCSW

## 2017-05-22 NOTE — Progress Notes (Signed)
Patient ID: Ricky Sparks, male   DOB: [birth year], 22 y.o.   MRN: [id number] D: Pt verbalizes feeling anxious today, was medicated with Atarax 50mg  at 0741am and at 1240pm respectively.  Pt refused to take his scheduled dose of Abilify in the morning, and stated that he "had a reaction to it", and was unspecific about what kind of reaction it is.  Pt stated that he is "having withdrawals from Adderall", and wanted something for this.  Pt's treatment team is aware.    Pt reports his sleep quality last night as being fair, reports his energy level as normal, reports his concentration level as good, rates his depression today as "3" (10 being the worst, rates his hopelessness level as "3" (10 being the worst), and rates his anxiety level as "6" (10 being the worst).  Pt reports having tremors and a runny nose, chills and irritability, but these not observed by staff.  Pt denies having any physical pain, denies SI/HI/AVH, and states what is important for him today is "lowering anxiety".  A: Pt maintained on Q15 minute safety checks, and empathy provided.  Pt educated on alternative coping mechanisms, and verbalizes understanding.  R: Will continue to monitor.

## 2017-05-22 NOTE — [facility] Group Notes (Signed)
Pt did not attend wrap up group this evening. Pt took a shower instead.

## 2017-05-22 NOTE — Progress Notes (Signed)
[facility] MD Progress Note  05/22/2017 3:20 PM Ricky Sparks  MRN:  [id number] Subjective:  Patient  states that he "feels all right " overall. Reports having increased subjective feeling restlessness that he thinks may be due to Abilify. He describes as a vague feeling of restlessness,but does not currently present with psychomotor agitation or visible motor restlessness . Prior to admission patient had been engaging in self burning partly to see if he was " real" and also reported frequent Deja Vu experiences - he states he has not had these experiences as intensely since admission and is not currently endorsing depersonalization or derealization symptoms. He also notes history of ADHD, and states he has long term issues with attention and being able to make decisions if faced with distractions or too many options .  Denies suicidal ideations .   Objective: Patient seen ,  chart  reviewed and case discussed with treatment team.  Currently he presents alert, attentive, calm, but reporting subjective  restlessness associated with Abilify, as above. He currently denies hallucinations and does not appear internally preoccupied , no delusions expressed ( prior to admission had reported vague hallucinatory experiences such as hearing music or chatter ). His thought process seems better organized . Minimizes depression at this time, and denies suicidal ideations. As above, he states he has been diagnosed with ADHD and is hoping to start a medication to address this. We have reviewed options and have reviewed rationale to minimize use of amphetamine medications at this time. He agrees to Atomoxetine trial. As above , reports that Ability is causing some akathisia and prefers to try another medication. Options discussed - of note , he prefers a medication not often associated with weight gain. Agrees to Jordan trial for mood disorder and management of psychosis.  Principal Problem: Psychosis ([facility]) Diagnosis:    Patient Active Problem List   Diagnosis Date Noted  . Psychosis ([facility]CC) [F29] 05/20/2017  . Anxiety tension state [F41.9] 05/03/2017  . Dysthymia [F34.1] 08/26/2013  . ADD (attention deficit disorder) [F98.8] 05/01/2013   Total Time spent with patient: 20 minutes  Past Psychiatric History: See H&P  Past Medical History:  Past Medical History:  Diagnosis Date  . ADD (attention deficit disorder) 2009   History reviewed. No pertinent surgical history. Family History: History reviewed. No pertinent family history. Family Psychiatric  History: See H&P Social History:  Social History   Substance and Sexual Activity  Alcohol Use Yes     Social History   Substance and Sexual Activity  Drug Use No    Social History   Socioeconomic History  . Marital status: Single    Spouse name: None  . Number of children: None  . Years of education: None  . Highest education level: None  Social Needs  . Financial resource strain: None  . Food insecurity - worry: None  . Food insecurity - inability: None  . Transportation needs - medical: None  . Transportation needs - non-medical: None  Occupational History  . None  Tobacco Use  . Smoking status: Never Smoker  . Smokeless tobacco: Never Used  Substance and Sexual Activity  . Alcohol use: Yes  . Drug use: No  . Sexual activity: Yes    Birth control/protection: None  Other Topics Concern  . None  Social History Narrative  . None   Additional Social History:    Sleep: improved  Appetite:  Fair  Current Medications: Current Facility-Administered Medications  Medication Dose Route Frequency Provider Last  Rate Last Dose  . acetaminophen (TYLENOL) tablet 650 mg  650 mg Oral Q6H PRN Charm Rings, NP      . alum & mag hydroxide-simeth (MAALOX/MYLANTA) 200-200-20 MG/5ML suspension 30 mL  30 mL Oral Q4H PRN Charm Rings, NP      . atomoxetine (STRATTERA) capsule 18 mg  18 mg Oral Daily Clarissa Laird A, MD      .  hydrOXYzine (ATARAX/VISTARIL) tablet 50 mg  50 mg Oral TID PRN Charm Rings, NP   50 mg at 05/22/17 1240  . LORazepam (ATIVAN) tablet 0.5 mg  0.5 mg Oral Q6H PRN Caelan Atchley, Rockey Situ, MD      . lurasidone (LATUDA) tablet 20 mg  20 mg Oral Q breakfast Jaylee Lantry A, MD      . magnesium hydroxide (MILK OF MAGNESIA) suspension 30 mL  30 mL Oral Daily PRN Charm Rings, NP      . nicotine polacrilex (NICORETTE) gum 2 mg  2 mg Oral PRN Charm Rings, NP      . traZODone (DESYREL) tablet 50 mg  50 mg Oral QHS PRN Narissa Beaufort, Rockey Situ, MD        Lab Results: No results found for this or any previous visit (from the past 48 hour(s)).  Blood Alcohol level:  Lab Results  Component Value Date   ETH <10 05/19/2017    Metabolic Disorder Labs: No results found for: HGBA1C, MPG No results found for: PROLACTIN No results found for: CHOL, TRIG, HDL, CHOLHDL, VLDL, LDLCALC  Physical Findings: AIMS: Facial and Oral Movements Muscles of Facial Expression: None, normal Lips and Perioral Area: None, normal Jaw: None, normal Tongue: None, normal,Extremity Movements Upper (arms, wrists, hands, fingers): None, normal Lower (legs, knees, ankles, toes): None, normal, Trunk Movements Neck, shoulders, hips: None, normal, Overall Severity Severity of abnormal movements (highest score from questions above): None, normal Incapacitation due to abnormal movements: None, normal Patient's awareness of abnormal movements (rate only patient's report): No Awareness, Dental Status Current problems with teeth and/or dentures?: No Does patient usually wear dentures?: No  CIWA:    COWS:     Musculoskeletal: Strength & Muscle Tone: within normal limits Gait & Station: normal Patient leans: N/A  Psychiatric Specialty Exam: Physical Exam  ROS no chest pain, no shortness of breath, no vomiting   Blood pressure (!) 128/91, pulse (!) 110, temperature 98.4 F (36.9 C), temperature source Oral, resp. rate 12,  height 6' (1.829 m), weight 68 kg (150 lb).Body mass index is 20.34 kg/m.  General Appearance: improving grooming    Eye Contact:  improved    Speech:  Normal Rate  Volume:  Normal  Mood:  denies depression  Affect:  vaguely anxious   Thought Process:  Linear and Descriptions of Associations: Intact- at this time no thought disorder noted   Orientation:  Other:  fully alert and attentive  Thought Content:  Reports improvement /decrease in auditory hallucinations, and does not appear internally preoccupied, no delusions expressed at this time   Suicidal Thoughts:  No denies suicidal or self injurious ideations, denies homicidal or violent ideations  Homicidal Thoughts:  No  Memory:  recent and remote grossly intact   Judgement:  Fair- improving   Insight:  Fair- improving   Psychomotor Activity:  normal, but reports subjective restlessness   Concentration:  Concentration: Good and Attention Span: Good  Recall:  Good  Fund of Knowledge:  Good  Language:  Good  Akathisia:  No  Handed:  Right  AIMS (if indicated):     Assets:  Communication Skills Desire for Improvement Financial Resources/Insurance Housing Physical Health Social Support Transportation  ADL's:  Intact  Cognition:  WNL  Sleep:  Number of Hours: 6.75   Assessment - patient presents with partial improvement. Currently reports hallucinatory experiences have abated and does not appear internally preoccupied nor expresses any delusions. He denies any recent concerns of " not being real " ( which he had been experiencing prior to admission, and due to which he had been burning self ), and today minimizes depression. He reports subjective restlessness, akathisia on Abilify, and wants to change medications. At present he is agreeing to JordanLatuda. He reports long history of ADHD symptoms- agrees to Atomoxetine trial.    Treatment Plan Summary: Treatment Plan reviewed as below today 3/11  Encourage group and milieu  participation to work on coping skills and symptom reduction -Stop Abilify due to medication side effects  -Start Latuda 20 mg  QDAY  for Psychosis , Mood Disorder  -Start Ativan 0.5 mg Q 6 hours PRN as needed for anxiety or restlessness -Start Atomoxetine  18 mg QDAY  for ADHD  -Treatment team working on disposition planning options    Nehemiah MassedFernando Keishia Ground , MD

## 2017-05-23 DIAGNOSIS — F259 Schizoaffective disorder, unspecified: Secondary | ICD-10-CM

## 2017-05-23 DIAGNOSIS — F419 Anxiety disorder, unspecified: Secondary | ICD-10-CM

## 2017-05-23 MED ORDER — LORAZEPAM 0.5 MG PO TABS: 0.5000 mg | ORAL_TABLET | Freq: Four times a day (QID) | ORAL | Status: DC | PRN

## 2017-05-23 MED ORDER — HYDROXYZINE HCL 25 MG PO TABS
25.0000 mg | ORAL_TABLET | Freq: Three times a day (TID) | ORAL | Status: DC | PRN
  Administered 2017-05-23 (×2): 25 mg via ORAL
  Filled 2017-05-23 (×3): qty 1

## 2017-05-23 NOTE — [facility] Group Notes (Signed)
LCSW Group Therapy Note 05/23/2017 12:37 PM  Type of Therapy/Topic: Group Therapy: Feelings about Diagnosis  Participation Level: Active   Description of Group:  This group will allow patients to explore their thoughts and feelings about diagnoses they have received. Patients will be guided to explore their level of understanding and acceptance of these diagnoses. Facilitator will encourage patients to process their thoughts and feelings about the reactions of others to their diagnosis and will guide patients in identifying ways to discuss their diagnosis with significant others in their lives. This group will be process-oriented, with patients participating in exploration of their own experiences, giving and receiving support, and processing challenge from other group members.  Therapeutic Goals: 1. Patient will demonstrate understanding of diagnosis as evidenced by identifying two or more symptoms of the disorder 2. Patient will be able to express two feelings regarding the diagnosis 3. Patient will demonstrate their ability to communicate their needs through discussion and/or role play  Summary of Patient Progress:  Ricky Sparks was engaged and participated throughout the group session. Ricky Sparks contributed and stated that he felt validated when he learned he had a mental health diagnosis. Ricky Sparks stated he knew something was wrong prior to being diagnosed. Ricky Sparks reports that he often does not keep long term relationships due to the lack of trust for others. Ricky Sparks reports that he plans to being prioritizing his plans/goals and refrain from isolating.   Therapeutic Modalities:  Cognitive Behavioral Therapy Brief Therapy Feelings Identification    Deven Furia Catalina AntiguaWilliams LCSWA Clinical Social Worker

## 2017-05-23 NOTE — [facility] Suicide Risk Assessment (Signed)
[facility] INPATIENT:  Family/Significant Other Suicide Prevention Education  Suicide Prevention Education:  Education Completed; Ricky LoosenJennifer Sparks, mother, [phone], has been identified by the patient as the family member/significant other with whom the patient will be residing, and identified as the person(s) who will aid the patient in the event of a mental health crisis (suicidal ideations/suicide attempt).  With written consent from the patient, the family member/significant other has been provided the following suicide prevention education, prior to the and/or following the discharge of the patient.  The suicide prevention education provided includes the following:  Suicide risk factors  Suicide prevention and interventions  National Suicide Hotline telephone number  [facility] assessment telephone number  [facility] Assistance 911  [facility] and/or Residential Mobile Crisis Unit telephone number  Request made of family/significant other to:  Remove weapons (e.g., guns, rifles, knives), all items previously/currently identified as safety concern.  Ricky DikeJennifer reports she has secured one gun and her other son told her that Ricky has a second gun.  Ricky DikeJennifer and Ricky Sparks are going to his home tomorrow to try to locate the second gun.  Remove drugs/medications (over-the-counter, prescriptions, illicit drugs), all items previously/currently identified as a safety concern.  The family member/significant other verbalizes understanding of the suicide prevention education information provided.  The family member/significant other agrees to remove the items of safety concern listed above.  Ricky DikeJennifer just visited client this AM--he seems better but still anxious.  Her concerns for him include both suicidal thoughts and that he "loses touch with reality."  The recent text messages were communicating both issues to her.  Ricky tells her that he can't get away from his "jumbled  thoughts" and that daytime is hard but "nights are hell" because there is no one to talk to at night.    Ricky Sparks, Ricky Cashin Jon, LCSW 05/23/2017, 11:43 AM

## 2017-05-23 NOTE — [facility] Group Notes (Signed)
[facility] Group Notes:  (Nursing/MHT/Case Management/Adjunct)  Date:  05/23/2017  Time:  3:15 p.m.  Type of Therapy:  Nurse Education  Participation Level:  Active  Participation Quality:  Appropriate  Affect:  Appropriate  Cognitive:  Appropriate  Insight:  Appropriate  Engagement in Group:  Engaged  Modes of Intervention:  Education  Summary of Progress/Problems:  Patient appropriately participated in group.    Earline MayotteKnight, Verbie Babic Shephard 05/23/2017, 4:26 PM

## 2017-05-23 NOTE — Progress Notes (Signed)
Adult Psychoeducational Group Note  Date:  05/23/2017 Time:  9:44 AM  Group Topic/Focus:  Orientation:   The focus of this group is to educate the patient on the purpose and policies of crisis stabilization and provide a format to answer questions about their admission.  The group details unit policies and expectations of patients while admitted.  Participation Level:  Active  Participation Quality:  Appropriate  Affect:  Appropriate  Cognitive:  Alert  Insight: Appropriate  Engagement in Group:  Engaged  Modes of Intervention:  Discussion and Education  Additional Comments:    Pt participated in group. Pt's goal today is to work on his Pharmacist, communitysocial skills. Pt will accomplish this goal by trying to stay in the day room and interact with other patients and staff.   Karren CobbleFizah G Maximillian Habibi 05/23/2017, 9:44 AM

## 2017-05-23 NOTE — [facility] Group Notes (Signed)
Adult Psychoeducational Group Note  Date:  05/23/2017 Time:  10:29 PM  Group Topic/Focus:  Wrap-Up Group:   The focus of this group is to help patients review their daily goal of treatment and discuss progress on daily workbooks.  Participation Level:  Active  Participation Quality:  Appropriate and Attentive  Affect:  Appropriate  Cognitive:  Alert and Appropriate  Insight: Appropriate and Good  Engagement in Group:  Engaged  Modes of Intervention:  Discussion and Education  Additional Comments:  Pt attended and participated in wrap up group this evening. Pt had a good day. Pt goal was to prioritize, and they completed a list of things to do once they are discharged. A positive noted by the pt was that people are friendly here at the facility.   Chrisandra NettersOctavia A Jacquilyn Seldon 05/23/2017, 10:29 PM

## 2017-05-23 NOTE — Progress Notes (Signed)
D: Patient denies SI, HI or AVH. Patient is flat, depressed and anxious but otherwise pleasant and cooperative.  Pt. States that his sleep, energy and appetite are good.  Pt. Was able to meet with his mother this morning which made him happy as she works 2nd shift and would otherwise be unable to visit.  Pt states he is working on his isolation by attending group and interacting with staff/others on the unit.    A: Patient given emotional support from RN. Patient encouraged to come to staff with concerns and/or questions. Patient's medication routine continued. Patient's orders and plan of care reviewed.   R: Patient remains appropriate and cooperative. Will continue to monitor patient q15 minutes for safety.

## 2017-05-23 NOTE — Progress Notes (Signed)
Pt.'s mother works second shift and is unable to visit with her son during regular visiting hours.  Feliz Beamravis, NP informed and gave permission for her to have visitation prior to her shift in the morning.  Pt.s mother advised that it would be best to call before coming to ensure patients group time would not be interrupted.  She expressed understanding.

## 2017-05-23 NOTE — Progress Notes (Signed)
[facility] MD Progress Note  05/23/2017 1:25 PM Ricky Sparks  MRN:  [id number]   Subjective:  Patient reports that he feels much better after starting medications and that the Latuda has been better than the Abilify. He states that he got to see his mother this morning. He reports that he has not had any hallucinations or bad thoughts. He plans to return home, but states that he plans to stay with his mom a few days after discharge to ensure his stability. He denies any SI/HI/AVH and contracts for safety. He denies any depression or anxiety as well today.   Objective: Patient's chart and findings reviewed and discussed with treatment team. Patient presents in the day room. He has been attending group and participating. He has improved with communication, eye contact, and interaction. He has denied any medication side effects. CSW to contact mother about potential discharge soon. Will continue current medication regimen.     Principal Problem: Schizoaffective disorder (HCC) Diagnosis:   Patient Active Problem List   Diagnosis Date Noted  . Schizoaffective disorder (HCC) [F25.9] 05/20/2017  . Anxiety tension state [F41.9] 05/03/2017  . Dysthymia [F34.1] 08/26/2013  . ADD (attention deficit disorder) [F98.8] 05/01/2013   Total Time spent with patient: 15 minutes  Past Psychiatric History: See H&P  Past Medical History:  Past Medical History:  Diagnosis Date  . ADD (attention deficit disorder) 2009   History reviewed. No pertinent surgical history. Family History: History reviewed. No pertinent family history. Family Psychiatric  History: See H&P Social History:  Social History   Substance and Sexual Activity  Alcohol Use Yes     Social History   Substance and Sexual Activity  Drug Use No    Social History   Socioeconomic History  . Marital status: Single    Spouse name: None  . Number of children: None  . Years of education: None  . Highest education level: None  Social Needs   . Financial resource strain: None  . Food insecurity - worry: None  . Food insecurity - inability: None  . Transportation needs - medical: None  . Transportation needs - non-medical: None  Occupational History  . None  Tobacco Use  . Smoking status: Never Smoker  . Smokeless tobacco: Never Used  Substance and Sexual Activity  . Alcohol use: Yes  . Drug use: No  . Sexual activity: Yes    Birth control/protection: None  Other Topics Concern  . None  Social History Narrative  . None   Additional Social History:                         Sleep: Good  Appetite:  Good  Current Medications: Current Facility-Administered Medications  Medication Dose Route Frequency Provider Last Rate Last Dose  . acetaminophen (TYLENOL) tablet 650 mg  650 mg Oral Q6H PRN Charm Rings, NP      . alum & mag hydroxide-simeth (MAALOX/MYLANTA) 200-200-20 MG/5ML suspension 30 mL  30 mL Oral Q4H PRN Charm Rings, NP      . atomoxetine (STRATTERA) capsule 18 mg  18 mg Oral Daily Cobos, Rockey Situ, MD   18 mg at 05/23/17 0844  . hydrOXYzine (ATARAX/VISTARIL) tablet 25 mg  25 mg Oral TID PRN Cobos, Rockey Situ, MD   25 mg at 05/23/17 1037  . LORazepam (ATIVAN) tablet 0.5 mg  0.5 mg Oral Q6H PRN Cobos, Rockey Situ, MD      . lurasidone (LATUDA) tablet  20 mg  20 mg Oral Q breakfast Cobos, Rockey Situ, MD   20 mg at 05/23/17 0844  . magnesium hydroxide (MILK OF MAGNESIA) suspension 30 mL  30 mL Oral Daily PRN Charm Rings, NP      . nicotine polacrilex (NICORETTE) gum 2 mg  2 mg Oral PRN Charm Rings, NP   2 mg at 05/22/17 2056  . traZODone (DESYREL) tablet 50 mg  50 mg Oral QHS PRN Cobos, Rockey Situ, MD   50 mg at 05/22/17 2242    Lab Results: No results found for this or any previous visit (from the past 48 hour(s)).  Blood Alcohol level:  Lab Results  Component Value Date   ETH <10 05/19/2017    Metabolic Disorder Labs: No results found for: HGBA1C, MPG No results found for:  PROLACTIN No results found for: CHOL, TRIG, HDL, CHOLHDL, VLDL, LDLCALC  Physical Findings: AIMS: Facial and Oral Movements Muscles of Facial Expression: None, normal Lips and Perioral Area: None, normal Jaw: None, normal Tongue: None, normal,Extremity Movements Upper (arms, wrists, hands, fingers): None, normal Lower (legs, knees, ankles, toes): None, normal, Trunk Movements Neck, shoulders, hips: None, normal, Overall Severity Severity of abnormal movements (highest score from questions above): None, normal Incapacitation due to abnormal movements: None, normal Patient's awareness of abnormal movements (rate only patient's report): No Awareness, Dental Status Current problems with teeth and/or dentures?: No Does patient usually wear dentures?: No  CIWA:    COWS:     Musculoskeletal: Strength & Muscle Tone: within normal limits Gait & Station: normal Patient leans: N/A  Psychiatric Specialty Exam: Physical Exam  Nursing note and vitals reviewed. Constitutional: He is oriented to person, place, and time. He appears well-developed and well-nourished.  Respiratory: Effort normal.  Musculoskeletal: Normal range of motion.  Neurological: He is alert and oriented to person, place, and time.  Skin: Skin is warm.    Review of Systems  Constitutional: Negative.   HENT: Negative.   Eyes: Negative.   Respiratory: Negative.   Cardiovascular: Negative.   Gastrointestinal: Negative.   Genitourinary: Negative.   Musculoskeletal: Negative.   Skin: Negative.   Neurological: Negative.   Endo/Heme/Allergies: Negative.   Psychiatric/Behavioral: Negative.     Blood pressure 124/80, pulse (!) 105, temperature 97.7 F (36.5 C), temperature source Oral, resp. rate 16, height 6' (1.829 m), weight 68 kg (150 lb).Body mass index is 20.34 kg/m.  General Appearance: Casual  Eye Contact:  Good  Speech:  Clear and Coherent and Normal Rate  Volume:  Normal  Mood:  Euthymic  Affect:   Congruent  Thought Process:  Goal Directed and Descriptions of Associations: Intact  Orientation:  Full (Time, Place, and Person)  Thought Content:  WDL  Suicidal Thoughts:  No  Homicidal Thoughts:  No  Memory:  Immediate;   Good Recent;   Good Remote;   Good  Judgement:  Good  Insight:  Good  Psychomotor Activity:  Normal  Concentration:  Concentration: Good and Attention Span: Good  Recall:  Good  Fund of Knowledge:  Good  Language:  Good  Akathisia:  No  Handed:  Right  AIMS (if indicated):     Assets:  Communication Skills Desire for Improvement Financial Resources/Insurance Housing Physical Health Social Support Transportation  ADL's:  Intact  Cognition:  WNL  Sleep:  Number of Hours: 6   Problems Addressed: Schizoaffective  ADD  Treatment Plan Summary: Daily contact with patient to assess and evaluate symptoms and progress in treatment,  Medication management and Plan is to:  -Continue Latuda 20 mg PO Daily with breakfast for mood stability -Continue Atomoxetine 18 mg PO Daily for ADD -Continue Ativan 0.5 mg PO Q6H PRN for anxiety -Encourage group therapy participation  Maryfrances Bunnellravis B Loretta Doutt, FNP 05/23/2017, 1:25 PM

## 2017-05-24 DIAGNOSIS — F331 Major depressive disorder, recurrent, moderate: Secondary | ICD-10-CM

## 2017-05-24 DIAGNOSIS — F1994 Other psychoactive substance use, unspecified with psychoactive substance-induced mood disorder: Secondary | ICD-10-CM

## 2017-05-24 DIAGNOSIS — F329 Major depressive disorder, single episode, unspecified: Secondary | ICD-10-CM

## 2017-05-24 NOTE — Progress Notes (Signed)
Adult Psychoeducational Group Note  Date:  05/24/2017 Time:  9:57 PM  Group Topic/Focus:  Wrap-Up Group:   The focus of this group is to help patients review their daily goal of treatment and discuss progress on daily workbooks.  Participation Level:  Active  Participation Quality:  Appropriate  Affect:  Appropriate  Cognitive:  Appropriate  Insight: Appropriate  Engagement in Group:  Engaged  Modes of Intervention:  Discussion  Additional Comments:  Patient attended wrap=up group and participated.  Mahalia Dykes W Alonzo Owczarzak 05/24/2017, 9:57 PM

## 2017-05-24 NOTE — Progress Notes (Signed)
Data. Patient denies SI/HI/AVH. Verbally contracts for safety on the unit and to come to staff before acting of any self harm thoughts/feelings. Patient was very anxious, restless and demanding of his meds this AM "I have to take them with food and I have to take them now." To the point that patient had to be redirected multiple times to stay on his hall and to remain calm, until meds were ready. When patient received his medications, he was able to calm down and has been appropriate through out the rest of the shift. Affect is flat and anxious, alternatively. On his self assessment patient reports 0/10 for depression, 1/10 for haplessness and anxiety. His goal for today is, "Finish my priority list." Action. Emotional support and encouragement offered. Education provided on medication, indications and side effect. Q 15 minute checks done for safety. Response. Safety on the unit maintained through 15 minute checks.  Medications taken as prescribed. Attended groups. Remained calm and appropriate through out shift.

## 2017-05-24 NOTE — Plan of Care (Signed)
  Education: Will be free of psychotic symptoms 05/24/2017 0112 - Progressing by Delos HaringPhillips, Myranda Pavone A, RN Note Pt denies AVH at this time

## 2017-05-24 NOTE — Progress Notes (Signed)
Recreation Therapy Notes  Date: 3.13.19 Time: 9:30 a.m. Location: [address] Dayroom  Group Topic: Stress Management  Goal Area(s) Addresses:  Goal 1.1: To reduce stress  -Patient will report feeling a reduction in stress level  -Patient will identify the importance of stress management  -Patient will participate during stress management group treatment    Intervention: Stress Management  Activity: Meditattion- Recreation Therapy Intern played a guided meditation video. Patients were in a peaceful environment with soft lighting enhancing patients mood.   Education: Stress Management, Discharge Planning.   Education Outcome: Acknowledges edcuation/In group clarification offered/Needs additional education  Clinical Observations/Feedback:: Patient did not attend    Korey Arroyo, Recreation Therapy Intern   Hanna Ra 05/24/2017 8:10 AM 

## 2017-05-24 NOTE — Progress Notes (Signed)
D: Pt denies SI/HI/AVH. Pt is pleasant and cooperative. Pt stated he was doing good. Pt was pleased that he was able to see his mother this morning, because she works second shift and can't come during visitation hrs.   A: Pt was offered support and encouragement. Pt was given scheduled medications. Pt was encourage to attend groups. Q 15 minute checks were done for safety.   R:Pt attends groups and interacts well with peers and staff. Pt is taking medication. Pt has no complaints.Pt receptive to treatment and safety maintained on unit.

## 2017-05-24 NOTE — Plan of Care (Signed)
Patient has been attending more unit activities. Patient has remained injury free this shift. Patient has taken his medications as prescribed, this shift.

## 2017-05-24 NOTE — [facility] Group Notes (Signed)
[facility] Group Notes:  (Nursing/MHT/Case Management/Adjunct)  Date:  05/24/2017  Time:  5:47 PM  Type of Therapy:  Nurse Education  Participation Level:  Active  Participation Quality:  Appropriate  Affect:  Appropriate  Cognitive:  Appropriate  Insight:  Appropriate  Engagement in Group:  Engaged  Modes of Intervention:  Activity  Summary of Progress/Problems:  This group discussed the integral connectedness between the mind, body and the emotions and how ones body posture can significantly impact ones out look and emotions. Power stances were practiced and emotions were assessed before and after.  Ricky Sparks 05/24/2017, 5:47 PM
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
## 2017-05-24 NOTE — [facility] Group Notes (Signed)
[facility] Mental [employer] Association Group Therapy 05/24/2017 1:15pm  Type of Therapy: Mental Health Association Presentation  Participation Level: Active  Participation Quality: Attentive  Affect: Appropriate  Cognitive: Oriented  Insight: Developing/Improving  Engagement in Therapy: Engaged  Modes of Intervention: Discussion, Education and Socialization  Summary of Progress/Problems: Mental Health Association (MHA) Speaker came to talk about his personal journey with mental health. The pt processed ways by which to relate to the speaker. MHA speaker provided handouts and educational information pertaining to groups and services offered by the [facility]. Pt was engaged in speaker's presentation and was receptive to resources provided.    [employer], LCSW 05/24/2017 9:18 AM

## 2017-05-24 NOTE — Progress Notes (Signed)
Pt has been in the dayroom all evening socializing with peers.  He reports he had a good day.  He denies SI/HI/AVH.  He voiced no needs or concerns with Clinical research associatewriter.  He has been appropriate and cooperative with staff.  Pt makes his needs known to staff.  Pt is med compliant and requested a sleep aid which he was given.  Support and encouragement offered.  Discharge plans are in process.  Safety maintained with q15 minute checks.

## 2017-05-24 NOTE — Progress Notes (Signed)
Adult Psychoeducational Group Note  Date:  05/24/2017 Time:  7:20 PM  Group Topic/Focus:  Building Self Esteem:   The Focus of this group is helping patients become aware of the effects of self-esteem on their lives, the things they and others do that enhance or undermine their self-esteem, seeing the relationship between their level of self-esteem and the choices they make and learning ways to enhance self-esteem.  Participation Level:  Active  Participation Quality:  Appropriate  Affect:  Appropriate  Cognitive:  Alert and Appropriate  Insight: Appropriate, Good and Improving  Engagement in Group:  Engaged  Modes of Intervention:  Activity and Discussion  Additional Comments:  Pt attended group today and participated in activity and discussion.  Ricky Sparks R [name] 05/24/2017, 7:20 PM

## 2017-05-25 MED ORDER — LURASIDONE HCL 20 MG PO TABS: 20.0000 mg | ORAL_TABLET | Freq: Every day | ORAL | 0 refills | Status: DC

## 2017-05-25 MED ORDER — HYDROXYZINE HCL 25 MG PO TABS: 25.0000 mg | ORAL_TABLET | Freq: Three times a day (TID) | ORAL | 0 refills | Status: DC | PRN

## 2017-05-25 MED ORDER — ATOMOXETINE HCL 18 MG PO CAPS: 18.0000 mg | ORAL_CAPSULE | Freq: Every day | ORAL | 0 refills | Status: DC

## 2017-05-25 MED ORDER — TRAZODONE HCL 50 MG PO TABS: 50.0000 mg | ORAL_TABLET | Freq: Every evening | ORAL | 0 refills | Status: DC | PRN

## 2017-05-25 MED FILL — hydrOXYzine HCL 25 MG TABS: 25 | 10 days supply | Qty: 30 | Fill #0

## 2017-05-25 MED FILL — ATOMOXETINE HCL 18 MG CAP: 18 | 30 days supply | Qty: 30 | Fill #0

## 2017-05-25 MED FILL — traZODone HCL 50 MG TABS: 50 | 30 days supply | Qty: 30 | Fill #0

## 2017-05-25 NOTE — Progress Notes (Signed)
Voicemail left for pt to come pick up five day supply of sample medications (Lautuda).
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
## 2017-05-25 NOTE — Progress Notes (Signed)
  [facility] :  Will you be returning to the same living situation after discharge:  No. At discharge, do you have transportation home?: Yes,  mother Do you have the ability to pay for your medications: Yes,  [facility]  Release of information consent forms completed and in the chart;  Patient's signature needed at discharge.  Patient to Follow up at: Follow-up Information    [facility]. Go on 06/07/2017.   Why:  Please attend your medication appt with Dr Jannifer FranklinAkintayo on Wednesday, 06/07/17, at 11:00am. Please attend your therapy appointment 06/15/17 at 9:00am. Contact information: [address] [place] [address] [phone]           Next level of care provider has access to [facility] Link:no  Safety Planning and Suicide Prevention discussed: Yes,  with mother  Have you used any form of tobacco in the last 30 days? (Cigarettes, Smokeless Tobacco, Cigars, and/or Pipes): Yes  Has patient been referred to the Quitline?: Patient refused referral  Patient has been referred for addiction treatment: Yes  [name], Jamiria Langill Jon, LCSW 05/25/2017, 10:31 AM

## 2017-05-25 NOTE — Progress Notes (Signed)
Pt attended morning orientation/goals group and understood the unit rules while stating that his goal for the day is to be ready to speak with the doctor when he is available.

## 2017-05-25 NOTE — Progress Notes (Signed)
CSW previously spoke with pt and pt's mother about outpt provider--[place], [facility] fairly close for them. Other providers may have sooner appt options but would be farther away.  Pt OK with waiting for appt times.  Pt is interested in [facility] [place] [facility] and plans to complete intake there immediately.  CSW spoke with Raiford NobleRick from [facility] [place] who will connect with pt prior to his discharge today. Garner NashGregory Yalexa Blust, MSW, LCSW Clinical Social Worker 05/25/2017 10:09 AM

## 2017-05-25 NOTE — Progress Notes (Signed)
Pt received both written and verbal discharge instructions. Pt verbalized understanding of discharge instructions. Pt agreed to f/u appt and med regimen. Pt received d/c packet and prescriptions. Pt gathered belongings from room and locker.  

## 2017-06-07 DIAGNOSIS — F341 Dysthymic disorder: Secondary | ICD-10-CM | POA: Diagnosis not present

## 2017-06-07 DIAGNOSIS — F419 Anxiety disorder, unspecified: Secondary | ICD-10-CM | POA: Diagnosis not present

## 2017-06-07 DIAGNOSIS — F321 Major depressive disorder, single episode, moderate: Secondary | ICD-10-CM | POA: Diagnosis not present

## 2017-06-07 DIAGNOSIS — F900 Attention-deficit hyperactivity disorder, predominantly inattentive type: Secondary | ICD-10-CM | POA: Diagnosis not present

## 2017-06-07 MED FILL — lamoTRIgine 25 MG TABS: 25 | 30 days supply | Qty: 30 | Fill #0

## 2017-06-07 MED FILL — ADDERALL XR 10 MG CAP SA: 10 | 30 days supply | Qty: 30 | Fill #0

## 2017-06-15 DIAGNOSIS — F323 Major depressive disorder, single episode, severe with psychotic features: Secondary | ICD-10-CM | POA: Diagnosis not present

## 2017-06-15 DIAGNOSIS — F411 Generalized anxiety disorder: Secondary | ICD-10-CM | POA: Diagnosis not present

## 2017-06-15 DIAGNOSIS — F900 Attention-deficit hyperactivity disorder, predominantly inattentive type: Secondary | ICD-10-CM | POA: Diagnosis not present

## 2017-06-16 ENCOUNTER — Ambulatory Visit: Payer: Self-pay | Admitting: Internal Medicine

## 2017-06-16 NOTE — Progress Notes (Signed)
NO SHOW

## 2017-06-20 DIAGNOSIS — F900 Attention-deficit hyperactivity disorder, predominantly inattentive type: Secondary | ICD-10-CM | POA: Diagnosis not present

## 2017-06-20 MED FILL — traZODone HCL 50 MG TABS: 50 | 30 days supply | Qty: 30 | Fill #0

## 2017-07-04 DIAGNOSIS — F900 Attention-deficit hyperactivity disorder, predominantly inattentive type: Secondary | ICD-10-CM | POA: Diagnosis not present

## 2017-07-04 DIAGNOSIS — F419 Anxiety disorder, unspecified: Secondary | ICD-10-CM | POA: Diagnosis not present

## 2017-07-04 DIAGNOSIS — F341 Dysthymic disorder: Secondary | ICD-10-CM | POA: Diagnosis not present

## 2017-07-07 MED FILL — ADDERALL XR 15 MG CAP SA: 15 | 30 days supply | Qty: 30 | Fill #0

## 2017-07-07 MED FILL — hydrOXYzine HCL 25 MG TABS: 25 | 30 days supply | Qty: 60 | Fill #0

## 2017-07-07 MED FILL — lamoTRIgine 25 MG TABS: 25 | 30 days supply | Qty: 60 | Fill #0

## 2017-07-20 DIAGNOSIS — F341 Dysthymic disorder: Secondary | ICD-10-CM | POA: Diagnosis not present

## 2017-07-20 DIAGNOSIS — F900 Attention-deficit hyperactivity disorder, predominantly inattentive type: Secondary | ICD-10-CM | POA: Diagnosis not present

## 2017-07-20 DIAGNOSIS — F419 Anxiety disorder, unspecified: Secondary | ICD-10-CM | POA: Diagnosis not present

## 2017-07-24 ENCOUNTER — Telehealth: Payer: Self-pay | Admitting: *Deleted

## 2017-07-24 NOTE — Telephone Encounter (Signed)
Per Dr Oneta Rack, a message was left to inform the patient he can no longer refll Adderall or any controlled substances.

## 2017-07-25 MED FILL — GABAPENTIN 100 MG CAPSULE: 100 | 30 days supply | Qty: 90 | Fill #0

## 2017-08-04 DIAGNOSIS — F419 Anxiety disorder, unspecified: Secondary | ICD-10-CM | POA: Diagnosis not present

## 2017-08-04 DIAGNOSIS — F900 Attention-deficit hyperactivity disorder, predominantly inattentive type: Secondary | ICD-10-CM | POA: Diagnosis not present

## 2017-08-04 DIAGNOSIS — F341 Dysthymic disorder: Secondary | ICD-10-CM | POA: Diagnosis not present

## 2017-08-04 MED FILL — ADDERALL XR 20 MG CAP SA: 20 | 30 days supply | Qty: 30 | Fill #0

## 2017-09-04 DIAGNOSIS — F900 Attention-deficit hyperactivity disorder, predominantly inattentive type: Secondary | ICD-10-CM | POA: Diagnosis not present

## 2017-09-04 DIAGNOSIS — F341 Dysthymic disorder: Secondary | ICD-10-CM | POA: Diagnosis not present

## 2017-09-04 DIAGNOSIS — F419 Anxiety disorder, unspecified: Secondary | ICD-10-CM | POA: Diagnosis not present

## 2017-09-04 MED FILL — GABAPENTIN 300 MG CAPSULE: 300 | 30 days supply | Qty: 90 | Fill #0

## 2017-09-04 MED FILL — traZODone HCL 50 MG TABS: 50 | 30 days supply | Qty: 30 | Fill #0

## 2017-09-04 MED FILL — AMPHETAMINE SALTS 30 MG TAB: 30 | 30 days supply | Qty: 30 | Fill #0

## 2017-10-03 DIAGNOSIS — F419 Anxiety disorder, unspecified: Secondary | ICD-10-CM | POA: Diagnosis not present

## 2017-10-03 DIAGNOSIS — F900 Attention-deficit hyperactivity disorder, predominantly inattentive type: Secondary | ICD-10-CM | POA: Diagnosis not present

## 2017-10-03 DIAGNOSIS — F341 Dysthymic disorder: Secondary | ICD-10-CM | POA: Diagnosis not present

## 2017-10-03 MED FILL — FLUoxetine HCL 20 MG CAPS: 20 | 30 days supply | Qty: 30 | Fill #0

## 2017-10-03 MED FILL — traZODone HCL 50 MG TABS: 50 | 30 days supply | Qty: 30 | Fill #0

## 2017-10-04 MED FILL — AMPHETAMINE SALTS 30 MG TAB: 30 | 30 days supply | Qty: 30 | Fill #0

## 2017-11-06 MED FILL — AMPHETAMINE SALTS 30 MG TAB: 30 | 30 days supply | Qty: 30 | Fill #0

## 2017-11-28 DIAGNOSIS — F419 Anxiety disorder, unspecified: Secondary | ICD-10-CM | POA: Diagnosis not present

## 2017-11-28 DIAGNOSIS — F900 Attention-deficit hyperactivity disorder, predominantly inattentive type: Secondary | ICD-10-CM | POA: Diagnosis not present

## 2017-11-28 DIAGNOSIS — F341 Dysthymic disorder: Secondary | ICD-10-CM | POA: Diagnosis not present

## 2017-12-04 MED FILL — FLUoxetine HCL 20 MG CAPS: 20 | 30 days supply | Qty: 30 | Fill #1

## 2017-12-07 MED FILL — ADDERALL XR 30 MG CAP SA: 30 | 30 days supply | Qty: 30 | Fill #0

## 2018-01-04 MED FILL — traZODone HCL 50 MG TABS: 50 | 30 days supply | Qty: 30 | Fill #1

## 2018-01-04 MED FILL — ADDERALL XR 30 MG CAP SA: 30 | 30 days supply | Qty: 30 | Fill #0

## 2018-01-25 DIAGNOSIS — F419 Anxiety disorder, unspecified: Secondary | ICD-10-CM | POA: Diagnosis not present

## 2018-01-25 DIAGNOSIS — F341 Dysthymic disorder: Secondary | ICD-10-CM | POA: Diagnosis not present

## 2018-01-25 DIAGNOSIS — F900 Attention-deficit hyperactivity disorder, predominantly inattentive type: Secondary | ICD-10-CM | POA: Diagnosis not present

## 2018-01-25 MED FILL — QUETIAPINE FUMARATE 50 MG T: 50 | 30 days supply | Qty: 30 | Fill #0

## 2018-01-25 MED FILL — FLUoxetine HCL 10 MG CAPS: 10 | 30 days supply | Qty: 30 | Fill #0

## 2018-02-05 MED FILL — ADDERALL XR 30 MG CAP SA: 30 | 30 days supply | Qty: 30 | Fill #0

## 2018-03-08 MED FILL — QUETIAPINE FUMARATE 50 MG T: 50 | 30 days supply | Qty: 30 | Fill #1

## 2018-03-08 MED FILL — ADDERALL XR 30 MG CAP SA: 30 | 30 days supply | Qty: 30 | Fill #0

## 2018-03-23 ENCOUNTER — Encounter: Payer: Self-pay | Admitting: Adult Health Nurse Practitioner

## 2018-03-23 ENCOUNTER — Ambulatory Visit (INDEPENDENT_AMBULATORY_CARE_PROVIDER_SITE_OTHER): Payer: 59 | Admitting: Adult Health Nurse Practitioner

## 2018-03-23 VITALS — BP 108/64 | HR 93 | Temp 98.2°F | Ht 71.0 in | Wt 172.8 lb

## 2018-03-23 DIAGNOSIS — F988 Other specified behavioral and emotional disorders with onset usually occurring in childhood and adolescence: Secondary | ICD-10-CM | POA: Diagnosis not present

## 2018-03-23 DIAGNOSIS — J029 Acute pharyngitis, unspecified: Secondary | ICD-10-CM | POA: Diagnosis not present

## 2018-03-23 DIAGNOSIS — R05 Cough: Secondary | ICD-10-CM | POA: Diagnosis not present

## 2018-03-23 DIAGNOSIS — F1994 Other psychoactive substance use, unspecified with psychoactive substance-induced mood disorder: Secondary | ICD-10-CM | POA: Insufficient documentation

## 2018-03-23 DIAGNOSIS — G4700 Insomnia, unspecified: Secondary | ICD-10-CM

## 2018-03-23 DIAGNOSIS — J069 Acute upper respiratory infection, unspecified: Secondary | ICD-10-CM | POA: Diagnosis not present

## 2018-03-23 DIAGNOSIS — R059 Cough, unspecified: Secondary | ICD-10-CM

## 2018-03-23 MED ORDER — ZOLPIDEM TARTRATE 5 MG PO TABS: 5.0000 mg | ORAL_TABLET | Freq: Every evening | ORAL | 0 refills | Status: DC | PRN

## 2018-03-23 MED FILL — ZOLPIDEM TARTRATE 5 MG TAB: 5 | 30 days supply | Qty: 30 | Fill #0

## 2018-03-23 NOTE — Progress Notes (Signed)
Assessment and Plan:  Ricky Sparks was seen today for acute visit, cough, other and generalized body aches.  Diagnoses and all orders for this visit:  Viral upper respiratory tract infection Treat the symptoms:  -Flonase: One spray in each nostril daily   -Saline Nasal Spray: You can get this at any pharmacy Use as directed on package This will help to sooth inside of your nose from irritation  -Zyrtec / Cetirizine Take 10mg  by mouth May cause drowsiness, take nightly Be sure to drink plenty of water If this is not effective, try Xyzal or Allegra  -Ibuprofen / Advil / Motrin  600mg  every six hours OR 800mg  every 8 hours If you have this at home each tablet is 200mg . Take this with food  Cough:   -Mucinex D  This will help with cough and sinus congestion Take one tablet by mouth every 12 hours with plenty of water while you have symptoms. This is an expectorant and will help to clear the congestion in your lungs  -Delsym OTC Samples provided  Sore Throat:  -Cepacol lozenges as needed You can get these at any pharmacy  -Throat Spray: Use as directed on package as needed You may get this at any pharmacy  -Warm / Cold drinks: This will help to sooth your throat  -Gargle warm salt water: Gargle then spit water out, do not swallow  Insomnia, unspecified type -     zolpidem (AMBIEN) 5 MG tablet; Take 1 tablet (5 mg total) by mouth at bedtime as needed for sleep.  Attention deficit disorder, unspecified hyperactivity presence Reports doing well with this, continue with benefit Discussed timing of his medication  Follow up in two weeks for insomnia  Call or return with new or worsening symptoms as discussed in appointment.  May contact via office phone [phone] or via MyChart.   There are no diagnoses linked to this encounter.    Further disposition pending results of labs. Discussed med's effects and SE's.   Over 30 minutes of exam, counseling, chart  review, and critical decision making was performed.   Future Appointments  Date Time Provider Department Center  04/06/2018  9:00 AM Elder Negus, NP [facility] None    ------------------------------------------------------------------------------------------------------------------   HPI 23 y.o.male presents for Ricky Sparks symptoms that start about four days ago.  Started with sore throat.  Reports that it is only in the mornings and after getting up and moving around this impros.  Reports that he has body aches all over.  Reports he has a cough that is productive.The cough is aggravated by laying down. Denies pain with a deep breath or wheezing.  Denies any chest pains or dizziness. Denies any ear pain bu has noted some pressure and cracking in his ears.  He has tried dayquil and some sudafed.  Reports that the sudafed helped the most.  He reports that he has had trouble sleeping for the past 1-1.5 years.  Reports he has trouble falling asleep and staying asleep.  When he does fall asleep he wakes up several times but is able to go back to sleep.  He has tried melatonin in the past as well a relaxation breathing techniques.  He has tried trazodone 50mg  and that made him feel hung over the next day.  He has tried half tablet that also made him feel bad the next day.  He is currently taking Seroquel and has been on this for two months now.  Reports he was taking ambien at one point and  did well on it. He denies and SI and HI.  Reports his mood has been well controlled.  He denies any new stressors in his life or significant changes.  Denies ETOH or and substance abuse.  He is working for a [employer].  Reports he lives in an apartment but also stays with his mother some of the time.     Past Medical History:  Diagnosis Date  . ADD (attention deficit disorder) 2009     Allergies  Allergen Reactions  . Penicillins Rash    Has patient had a PCN reaction causing immediate rash,  facial/tongue/throat swelling, SOB or lightheadedness with hypotension: Yes Has patient had a PCN reaction causing severe rash involving mucus membranes or skin necrosis: No Has patient had a PCN reaction that required hospitalization:Yes Has patient had a PCN reaction occurring within the last 10 years: No If all of the above answers are "NO", then may proceed with Cephalosporin use.     Current Outpatient Medications on File Prior to Visit  Medication Sig  . atomoxetine (STRATTERA) 18 MG capsule Take 1 capsule (18 mg total) by mouth daily. For ADD  . hydrOXYzine (ATARAX/VISTARIL) 25 MG tablet Take 1 tablet (25 mg total) by mouth 3 (three) times daily as needed (mild/moderate anxiety).  Marland Kitchen lurasidone (LATUDA) 20 MG TABS tablet Take 1 tablet (20 mg total) by mouth daily with breakfast. For mood control Take with food  . traZODone (DESYREL) 50 MG tablet Take 1 tablet (50 mg total) by mouth at bedtime as needed for sleep.   No current facility-administered medications on file prior to visit.     ROS: all negative except above.   Physical Exam:  BP 108/64   Pulse 93   Temp 98.2 F (36.8 C)   Ht 5\' 11"  (1.803 m)   Wt 172 lb 12.8 oz (78.4 kg)   SpO2 99%   BMI 24.10 kg/m   General Appearance: Well nourished, in no apparent distress. Eyes: PERRLA, EOMs, conjunctiva no swelling or erythema Sinuses: No Frontal/maxillary tenderness ENT/Mouth: Ext aud canals clear, TMs without erythema, bulging.  Mild erythema with drainage noted. No exudate on post pharynx.  Tonsils not swollen or erythematous. Hearing normal.  Neck: Supple, thyroid normal.  Respiratory: Respiratory effort normal, BS equal bilaterally without rales,wheezing or stridor. Rhonchi noted bilaterally. Cardio: RRR with no MRGs. Brisk peripheral pulses without edema.  Abdomen: Soft, + BS.  Non tender, no guarding, rebound, hernias, masses. Lymphatics: Non tender without lymphadenopathy.    Skin: Warm, dry without rashes,  lesions, ecchymosis.   Psych: Awake and oriented X 3, normal affect, Insight and Judgment appropriate.     Elder Negus, NP 12:22 PM [facility]

## 2018-03-23 NOTE — Patient Instructions (Addendum)
Today you are being treated for:   Upper Respiratory Symptoms  Please take these medications:    Cough:   Mucinex D  This will help with cough and sinus congestion Take one tablet by mouth every 12 hours with plenty of water while you have symptoms. This is an expectorant and will help to clear the congestion in your lungs  Delsym: OTC, samples Follow directions on package  Sinus Congestion:   Flonase: One spray in each nostril daily   Saline Nasal Spray: You can get this at any pharmacy Use as directed on package This will help to sooth inside of your nose from irritation  Zyrtec / Cetirizine Take 10mg  by mouth May cause drowsiness, take nightly Be sure to drink plenty of water If this is not effective, try Xyzal or Allegra  For inflammation:  Ibuprofen / Advil / Motrin  600mg  every six hours OR 800mg  every 8 hours If you have this at home each tablet is 200mg . Take this with food  Sore Throat:  Cepacol lozenges as needed You can get these at any pharmacy  Throat Spray: Use as directed on package as needed You may get this at any pharmacy  Warm / Cold drinks: This will help to sooth your throat  Gargle warm salt water: Gargle then spit water out, do not swallow   General Care:  Drink plenty of clear fluids  Get plenty of rest  Wash hands frequently and sanitize shared surfaces, kitchens, bathrooms.  Call or return with new or worsening symptoms    We have sent in [place] for you to take at night as needed for sleep.  Take this 30-1hour prior to going to bed. Monitor your symptoms and be aware of any change in your mood.  Let those around you know you are taking this medicaiton.  Follow up in 2 weeks to discuss how this is going.  Here are other tips for you.  11 Tips to Follow:  1. No caffeine after 3pm: Avoid beverages with caffeine (soda, tea, energy drinks, etc.) especially after 3pm. 2. Don't go to bed hungry: Have your evening meal at  least 3 hrs. before going to sleep. It's fine to have a small bedtime snack such as a glass of milk and a few crackers but don't have a big meal. 3. Have a nightly routine before bed: Plan on "winding down" before you go to sleep. Begin relaxing about 1 hour before you go to bed. Try doing a quiet activity such as listening to calming music, reading a book or meditating. 4. Turn off the TV and ALL electronics including video games, tablets, laptops, etc. 1 hour before sleep, and keep them out of the bedroom. 5. Turn off your cell phone and all notifications (new email and text alerts) or even better, leave your phone outside your room while you sleep. Studies have shown that a part of your brain continues to respond to certain lights and sounds even while you're still asleep. 6. Make your bedroom quiet, dark and cool. If you can't control the noise, try wearing earplugs or using a fan to block out other sounds. 7. Practice relaxation techniques. Try reading a book or meditating or drain your brain by writing a list of what you need to do the next day. 8. Don't nap unless you feel sick: you'll have a better night's sleep. 9. Don't smoke, or quit if you do. Nicotine, alcohol, and marijuana can all keep you awake. Talk to your  health care provider if you need help with substance use. 10. Most importantly, wake up at the same time every day (or within 1 hour of your usual wake up time) EVEN on the weekends. A regular wake up time promotes sleep hygiene and prevents sleep problems. 11. Reduce exposure to bright light in the last three hours of the day before going to sleep. Maintaining good sleep hygiene and having good sleep habits lower your risk of developing sleep problems. Getting better sleep can also improve your concentration and alertness. Try the simple steps in this guide. If you still have trouble getting enough rest, make an appointment with your health care provider.

## 2018-04-05 DIAGNOSIS — G4700 Insomnia, unspecified: Secondary | ICD-10-CM | POA: Insufficient documentation

## 2018-04-05 NOTE — Progress Notes (Signed)
Assessment and Plan:  Ricky Sparks was seen today for follow-up.  Diagnoses and all orders for this visit:  Attention deficit disorder, unspecified hyperactivity presence Continue Adderall XL Continue Appointment with Dr Ovidio Kin  Insomnia, unspecified type Discussed good sleep hygiene discussed, increase day time activity, try melatonin or benadryl if this does not help we will call in sleep medication.   -     zolpidem (AMBIEN) 5 MG tablet; Take 1 tablet (5 mg total) by mouth at bedtime as needed for sleep.  Cough Improved from last visit Intermittent, utilizing cough drops during the day   Further disposition pending results of labs. Discussed med's effects and SE's.   Over 30 minutes of exam, counseling, chart review, and critical decision making was performed.   Future Appointments  Date Time Provider Department Center  05/11/2018  8:45 AM Elder Negus, NP [facility] None    ------------------------------------------------------------------------------------------------------------------   HPI 22 y.o.male presents for follow up on insomnia.  Reports that he has been taking the ambien 5mg  as needed for sleep.  Reports that over the course of the past week he has taken this 9 of 15 nights.  He reports he feels well rested the next day.  Reports dramatic improvement in his overall mood. Denies any adverse side effects from this medication.  He has restarted his AdderallXR since the last visit.       He follows witrh Dr Ovidio Kin for this.  Reports his mood has been well controlled and he denies any anxiety, depression, hallucinations visual or audio, or SI/HI.  Reports he has a good support system and he is living at home as well as in his apartment at times.  Endorses a positive relationship with his parents and social group.  He is currently looking for a new job and has interviews set up.  Discussed importance of medication compliance.  Related to nature of medication we discussed  close follow up and he is to schedule and appointment once a month to receive this prescription and is agreeable with this plan of care.      Past Medical History:  Diagnosis Date  . ADD (attention deficit disorder) 2009     Allergies  Allergen Reactions  . Penicillins Rash    Has patient had a PCN reaction causing immediate rash, facial/tongue/throat swelling, SOB or lightheadedness with hypotension: Yes Has patient had a PCN reaction causing severe rash involving mucus membranes or skin necrosis: No Has patient had a PCN reaction that required hospitalization:Yes Has patient had a PCN reaction occurring within the last 10 years: No If all of the above answers are "NO", then may proceed with Cephalosporin use.     Current Outpatient Medications on File Prior to Visit  Medication Sig  . amphetamine-dextroamphetamine (ADDERALL XR) 30 MG 24 hr capsule Take 30 mg by mouth daily.  Marland Kitchen atomoxetine (STRATTERA) 18 MG capsule Take 1 capsule (18 mg total) by mouth daily. For ADD   No current facility-administered medications on file prior to visit.     ROS: all negative except above.   Physical Exam:  BP 110/70   Pulse 91   Temp 98.1 F (36.7 C)   Ht 5\' 11"  (1.803 m)   Wt 165 lb 12.8 oz (75.2 kg)   SpO2 99%   BMI 23.12 kg/m   General Appearance: Well nourished, in no apparent distress. Eyes: PERRLA, EOMs, conjunctiva no swelling or erythema Respiratory: Respiratory effort normal, BS equal bilaterally without rales, rhonchi, wheezing or stridor.  Cardio: RRR  with no MRGs. Brisk peripheral pulses without edema.  Lymphatics: Non tender without lymphadenopathy.  Musculoskeletal: Full ROM, 5/5 strength, normal gait.  Skin: Warm, dry without rashes, lesions, ecchymosis.  Neuro: Cranial nerves intact. Normal muscle tone, no cerebellar symptoms. Sensation intact.  Psych: Awake and oriented X 3, normal affect, Insight and Judgment appropriate.     Elder Negus, NP 12:11  PM [facility]

## 2018-04-06 ENCOUNTER — Encounter: Payer: Self-pay | Admitting: Internal Medicine

## 2018-04-06 ENCOUNTER — Encounter: Payer: Self-pay | Admitting: Adult Health Nurse Practitioner

## 2018-04-06 ENCOUNTER — Ambulatory Visit (INDEPENDENT_AMBULATORY_CARE_PROVIDER_SITE_OTHER): Payer: 59 | Admitting: Adult Health Nurse Practitioner

## 2018-04-06 VITALS — BP 110/70 | HR 91 | Temp 98.1°F | Ht 71.0 in | Wt 165.8 lb

## 2018-04-06 DIAGNOSIS — R05 Cough: Secondary | ICD-10-CM

## 2018-04-06 DIAGNOSIS — R059 Cough, unspecified: Secondary | ICD-10-CM

## 2018-04-06 DIAGNOSIS — F988 Other specified behavioral and emotional disorders with onset usually occurring in childhood and adolescence: Secondary | ICD-10-CM | POA: Diagnosis not present

## 2018-04-06 DIAGNOSIS — G4700 Insomnia, unspecified: Secondary | ICD-10-CM | POA: Diagnosis not present

## 2018-04-06 MED ORDER — ZOLPIDEM TARTRATE 5 MG PO TABS: 5.0000 mg | ORAL_TABLET | Freq: Every evening | ORAL | 0 refills | Status: DC | PRN

## 2018-04-09 DIAGNOSIS — F419 Anxiety disorder, unspecified: Secondary | ICD-10-CM | POA: Diagnosis not present

## 2018-04-09 DIAGNOSIS — F341 Dysthymic disorder: Secondary | ICD-10-CM | POA: Diagnosis not present

## 2018-04-09 DIAGNOSIS — F900 Attention-deficit hyperactivity disorder, predominantly inattentive type: Secondary | ICD-10-CM | POA: Diagnosis not present

## 2018-04-09 MED FILL — QUETIAPINE FUMARATE 50 MG T: 50 | 30 days supply | Qty: 30 | Fill #0

## 2018-04-09 MED FILL — ADDERALL XR 30 MG CAP SA: 30 | 30 days supply | Qty: 30 | Fill #0

## 2018-05-09 MED FILL — ADDERALL XR 30 MG CAP SA: 30 | 30 days supply | Qty: 30 | Fill #0

## 2018-05-10 NOTE — Progress Notes (Deleted)
Assessment and Plan:  Diagnoses and all orders for this visit:  Insomnia, unspecified type Discussed good sleep hygiene discussed, increase day time activity, try melatonin or benadryl if this does not help we will call in sleep medication.   Attention deficit disorder, unspecified hyperactivity presence Doing well on current regiment Continue Adderall XL Continue Appointments with Dr Jannifer Franklin  Encounter for drug screening Will check Urine tox today related to dispensing of controlled substances. Discussed importance of medication compliance. Discussed unable to prescribe medication if positive for non-prescribed substances.   Further disposition pending results of labs. Discussed med's effects and SE's.   Over 30 minutes of exam, counseling, chart review, and critical decision making was performed.   Future Appointments  Date Time Provider Department Center  05/11/2018  8:45 AM Elder Negus, NP [facility] None    ------------------------------------------------------------------------------------------------------------------   HPI 23 y.o.male presents for follow up on insomnia.  Reports that he has been taking the ambien 5mg  as needed for sleep.  Reports that over the course of the past week he has taken this *** 9 of 15 nights.  He reports he feels well rested the next day.  Reports dramatic improvement in his overall mood. Denies any adverse side effects from this medication.  He has restarted his AdderallXR since the last visit.       He follows witrh Dr Jannifer Franklin for this.  Reports his mood has been well controlled and he denies any anxiety, depression, hallucinations visual or audio, or SI/HI.  Reports he has a good support system and he is living at home as well as in his apartment at times.  Endorses a positive relationship with his parents and social group.  He is currently looking for a new job and has interviews set up.  Discussed importance of medication compliance.   Related to nature of medication we discussed close follow up and he is to schedule and appointment once a month to receive this prescription and is agreeable with this plan of care.    Past Medical History:  Diagnosis Date  . ADD (attention deficit disorder) 2009     Allergies  Allergen Reactions  . Penicillins Rash    Current Outpatient Medications on File Prior to Visit  Medication Sig  . amphetamine-dextroamphetamine (ADDERALL XR) 30 MG 24 hr capsule Take 30 mg by mouth daily.  Marland Kitchen atomoxetine (STRATTERA) 18 MG capsule Take 1 capsule (18 mg total) by mouth daily. For ADD  . zolpidem (AMBIEN) 5 MG tablet Take 1 tablet (5 mg total) by mouth at bedtime as needed for sleep.   No current facility-administered medications on file prior to visit.     ROS: all negative except above.   Physical Exam:  There were no vitals taken for this visit.  General Appearance: Well nourished, in no apparent distress. Eyes: PERRLA, EOMs, conjunctiva no swelling or erythema Sinuses: No Frontal/maxillary tenderness ENT/Mouth: Ext aud canals clear, TMs without erythema, bulging. No erythema, swelling, or exudate on post pharynx.  Tonsils not swollen or erythematous. Hearing normal.  Neck: Supple, thyroid normal.  Respiratory: Respiratory effort normal, BS equal bilaterally without rales, rhonchi, wheezing or stridor.  Cardio: RRR with no MRGs. Brisk peripheral pulses without edema.  Abdomen: Soft, + BS.  Non tender, no guarding, rebound, hernias, masses. Lymphatics: Non tender without lymphadenopathy.  Musculoskeletal: Full ROM, 5/5 strength, normal gait.  Skin: Warm, dry without rashes, lesions, ecchymosis.  Neuro: Cranial nerves intact. Normal muscle tone, no cerebellar symptoms. Sensation intact.  Psych: Awake  and oriented X 3, normal affect, Insight and Judgment appropriate.     Elder Negus, NP 11:33 PM [facility]

## 2018-05-11 ENCOUNTER — Ambulatory Visit: Payer: Self-pay | Admitting: Adult Health Nurse Practitioner

## 2018-05-25 ENCOUNTER — Ambulatory Visit: Payer: Self-pay | Admitting: Adult Health Nurse Practitioner

## 2018-05-25 NOTE — Progress Notes (Deleted)
Patient has canceled at least three appointment this year.  Will dismiss from care related to this.     Elder Negus, NP [facility] 05/25/2018  9:11 AM

## 2018-05-30 DIAGNOSIS — F419 Anxiety disorder, unspecified: Secondary | ICD-10-CM | POA: Diagnosis not present

## 2018-05-30 DIAGNOSIS — F341 Dysthymic disorder: Secondary | ICD-10-CM | POA: Diagnosis not present

## 2018-05-30 DIAGNOSIS — F900 Attention-deficit hyperactivity disorder, predominantly inattentive type: Secondary | ICD-10-CM | POA: Diagnosis not present

## 2018-05-30 MED FILL — QUETIAPINE FUMARATE 200 MG: 200 | 30 days supply | Qty: 30 | Fill #0

## 2018-06-07 MED FILL — ADDERALL XR 30 MG CAP SA: 30 | 30 days supply | Qty: 30 | Fill #0

## 2018-07-03 DIAGNOSIS — F900 Attention-deficit hyperactivity disorder, predominantly inattentive type: Secondary | ICD-10-CM | POA: Diagnosis not present

## 2018-07-03 DIAGNOSIS — F419 Anxiety disorder, unspecified: Secondary | ICD-10-CM | POA: Diagnosis not present

## 2018-07-03 DIAGNOSIS — F341 Dysthymic disorder: Secondary | ICD-10-CM | POA: Diagnosis not present

## 2018-07-03 MED FILL — GABAPENTIN 300 MG CAPSULE: 300 | 30 days supply | Qty: 60 | Fill #0

## 2018-07-04 MED FILL — ADDERALL XR 30 MG CAP SA: 30 | 30 days supply | Qty: 30 | Fill #0

## 2018-07-30 MED FILL — GABAPENTIN 300 MG CAPSULE: 300 | 30 days supply | Qty: 60 | Fill #1

## 2018-08-02 MED FILL — ADDERALL XR 30 MG CAP SA: 30 | 30 days supply | Qty: 30 | Fill #0

## 2018-08-30 DIAGNOSIS — F341 Dysthymic disorder: Secondary | ICD-10-CM | POA: Diagnosis not present

## 2018-08-30 DIAGNOSIS — F419 Anxiety disorder, unspecified: Secondary | ICD-10-CM | POA: Diagnosis not present

## 2018-08-30 DIAGNOSIS — F900 Attention-deficit hyperactivity disorder, predominantly inattentive type: Secondary | ICD-10-CM | POA: Diagnosis not present

## 2018-08-30 MED FILL — GABAPENTIN 300 MG CAPSULE: 300 | 30 days supply | Qty: 90 | Fill #0

## 2018-09-04 MED FILL — ADDERALL XR 30 MG CAP SA: 30 | 30 days supply | Qty: 30 | Fill #0

## 2018-10-05 MED FILL — GABAPENTIN 300 MG CAPSULE: 300 | 30 days supply | Qty: 90 | Fill #1

## 2018-10-05 MED FILL — ADDERALL XR 30 MG CAP SA: 30 | 30 days supply | Qty: 30 | Fill #0

## 2018-10-26 DIAGNOSIS — F341 Dysthymic disorder: Secondary | ICD-10-CM | POA: Diagnosis not present

## 2018-10-26 DIAGNOSIS — F900 Attention-deficit hyperactivity disorder, predominantly inattentive type: Secondary | ICD-10-CM | POA: Diagnosis not present

## 2018-10-26 DIAGNOSIS — F419 Anxiety disorder, unspecified: Secondary | ICD-10-CM | POA: Diagnosis not present

## 2018-10-26 MED FILL — QUETIAPINE FUMARATE 50 MG T: 50 | 30 days supply | Qty: 30 | Fill #0

## 2018-11-02 MED FILL — ADDERALL XR 30 MG CAP SA: 30 | 30 days supply | Qty: 30 | Fill #0

## 2018-11-09 MED FILL — QUETIAPINE FUMARATE 50 MG T: 50 | 30 days supply | Qty: 30 | Fill #0

## 2018-11-09 MED FILL — GABAPENTIN 300 MG CAPSULE: 300 | 30 days supply | Qty: 90 | Fill #0

## 2018-12-03 MED FILL — ADDERALL XR 30 MG CAP SA: 30 | 30 days supply | Qty: 30 | Fill #0

## 2019-01-04 DIAGNOSIS — F419 Anxiety disorder, unspecified: Secondary | ICD-10-CM | POA: Diagnosis not present

## 2019-01-04 DIAGNOSIS — F341 Dysthymic disorder: Secondary | ICD-10-CM | POA: Diagnosis not present

## 2019-01-04 DIAGNOSIS — F900 Attention-deficit hyperactivity disorder, predominantly inattentive type: Secondary | ICD-10-CM | POA: Diagnosis not present

## 2019-01-04 MED FILL — GABAPENTIN 300 MG CAPSULE: 300 | 30 days supply | Qty: 90 | Fill #0

## 2019-01-04 MED FILL — QUETIAPINE FUMARATE 50 MG T: 50 | 30 days supply | Qty: 30 | Fill #0

## 2019-01-04 MED FILL — ADDERALL XR 30 MG CAP SA: 30 | 30 days supply | Qty: 30 | Fill #0

## 2019-02-04 MED FILL — ADDERALL XR 30 MG CAP SA: 30 | 30 days supply | Qty: 30 | Fill #0

## 2019-03-01 DIAGNOSIS — F900 Attention-deficit hyperactivity disorder, predominantly inattentive type: Secondary | ICD-10-CM | POA: Diagnosis not present

## 2019-03-01 DIAGNOSIS — F419 Anxiety disorder, unspecified: Secondary | ICD-10-CM | POA: Diagnosis not present

## 2019-03-01 DIAGNOSIS — F341 Dysthymic disorder: Secondary | ICD-10-CM | POA: Diagnosis not present

## 2019-03-01 MED FILL — GABAPENTIN 300 MG CAPSULE: 300 | 30 days supply | Qty: 90 | Fill #0

## 2019-03-01 MED FILL — QUETIAPINE FUMARATE 50 MG T: 50 | 30 days supply | Qty: 30 | Fill #0

## 2019-03-01 MED FILL — FLUoxetine HCL 10 MG CAPS: 10 | 30 days supply | Qty: 30 | Fill #0

## 2019-03-05 DIAGNOSIS — Z20828 Contact with and (suspected) exposure to other viral communicable diseases: Secondary | ICD-10-CM | POA: Diagnosis not present

## 2019-03-05 MED FILL — ADDERALL XR 30 MG CAP SA: 30 | 30 days supply | Qty: 30 | Fill #0

## 2019-04-05 MED FILL — ADDERALL XR 30 MG CAP SA: 30 | 30 days supply | Qty: 30 | Fill #0

## 2019-04-09 MED FILL — GABAPENTIN 300 MG CAPSULE: 300 | 30 days supply | Qty: 90 | Fill #0

## 2019-04-09 MED FILL — QUETIAPINE FUMARATE 50 MG T: 50 | 30 days supply | Qty: 30 | Fill #1

## 2019-04-09 MED FILL — FLUoxetine HCL 10 MG CAPS: 10 | 30 days supply | Qty: 30 | Fill #1

## 2019-04-25 DIAGNOSIS — F900 Attention-deficit hyperactivity disorder, predominantly inattentive type: Secondary | ICD-10-CM | POA: Diagnosis not present

## 2019-04-25 DIAGNOSIS — F419 Anxiety disorder, unspecified: Secondary | ICD-10-CM | POA: Diagnosis not present

## 2019-04-25 DIAGNOSIS — F341 Dysthymic disorder: Secondary | ICD-10-CM | POA: Diagnosis not present

## 2019-04-25 MED FILL — cloNIDine HCL 0.2 MG TABS: 0.2 | 30 days supply | Qty: 30 | Fill #0

## 2019-04-25 MED FILL — FLUoxetine HCL 20 MG CAPS: 20 | 30 days supply | Qty: 30 | Fill #0

## 2019-05-03 MED FILL — ADDERALL XR 30 MG CAP SA: 30 | 30 days supply | Qty: 30 | Fill #0

## 2019-05-27 ENCOUNTER — Other Ambulatory Visit: Payer: Self-pay

## 2019-05-27 ENCOUNTER — Ambulatory Visit (INDEPENDENT_AMBULATORY_CARE_PROVIDER_SITE_OTHER): Payer: 59 | Admitting: Licensed Clinical Social Worker

## 2019-05-27 ENCOUNTER — Encounter: Payer: Self-pay | Admitting: Licensed Clinical Social Worker

## 2019-05-27 DIAGNOSIS — Z23 Encounter for immunization: Secondary | ICD-10-CM | POA: Diagnosis not present

## 2019-05-27 DIAGNOSIS — F988 Other specified behavioral and emotional disorders with onset usually occurring in childhood and adolescence: Secondary | ICD-10-CM

## 2019-05-27 DIAGNOSIS — F341 Dysthymic disorder: Secondary | ICD-10-CM
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
## 2019-06-03 MED FILL — GABAPENTIN 300 MG CAPSULE: 300 | 30 days supply | Qty: 90 | Fill #0

## 2019-06-03 MED FILL — ADDERALL XR 30 MG CAP SA: 30 | 30 days supply | Qty: 30 | Fill #0

## 2019-06-17 DIAGNOSIS — Z23 Encounter for immunization: Secondary | ICD-10-CM | POA: Diagnosis not present

## 2019-06-19 ENCOUNTER — Ambulatory Visit: Payer: 59 | Admitting: Licensed Clinical Social Worker

## 2019-06-21 DIAGNOSIS — F341 Dysthymic disorder: Secondary | ICD-10-CM | POA: Diagnosis not present

## 2019-06-21 DIAGNOSIS — F419 Anxiety disorder, unspecified: Secondary | ICD-10-CM | POA: Diagnosis not present

## 2019-06-21 DIAGNOSIS — F900 Attention-deficit hyperactivity disorder, predominantly inattentive type: Secondary | ICD-10-CM | POA: Diagnosis not present

## 2019-06-21 MED FILL — cloNIDine HCL 0.2 MG TABS: 0.2 | 30 days supply | Qty: 30 | Fill #0

## 2019-06-21 MED FILL — FLUoxetine HCL 40 MG CAPS: 40 | 30 days supply | Qty: 30 | Fill #0

## 2019-06-29 MED FILL — GABAPENTIN 300 MG CAPSULE: 300 | 30 days supply | Qty: 90 | Fill #1

## 2019-07-03 MED FILL — FLUoxetine HCL 40 MG CAPS: 40 | 30 days supply | Qty: 30 | Fill #0 | Status: TO

## 2019-07-03 MED FILL — FLUoxetine HCL 40 MG CAPS: 40 | 30 days supply | Qty: 30 | Fill #0

## 2019-07-03 MED FILL — cloNIDine HCL 0.2 MG TABS: 0.2 | 30 days supply | Qty: 30 | Fill #0 | Status: TO

## 2019-07-03 MED FILL — cloNIDine HCL 0.2 MG TABS: 0.2 | 30 days supply | Qty: 30 | Fill #0

## 2019-07-05 MED FILL — ADDERALL XR 30 MG CAP SA: 30 | 30 days supply | Qty: 30 | Fill #0

## 2019-08-06 MED FILL — cloNIDine HCL 0.2 MG TABS: 0.2 | 30 days supply | Qty: 30 | Fill #0

## 2019-08-06 MED FILL — ADDERALL XR 30 MG CAP SA: 30 | 30 days supply | Qty: 30 | Fill #0

## 2019-08-06 MED FILL — FLUoxetine HCL 40 MG CAPS: 40 | 30 days supply | Qty: 30 | Fill #0

## 2019-08-19 DIAGNOSIS — F419 Anxiety disorder, unspecified: Secondary | ICD-10-CM | POA: Diagnosis not present

## 2019-08-19 DIAGNOSIS — F341 Dysthymic disorder: Secondary | ICD-10-CM | POA: Diagnosis not present

## 2019-08-19 DIAGNOSIS — F900 Attention-deficit hyperactivity disorder, predominantly inattentive type: Secondary | ICD-10-CM | POA: Diagnosis not present
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
## 2019-09-06 MED FILL — ADDERALL XR 30 MG CAP SA: 30 | 30 days supply | Qty: 30 | Fill #0

## 2019-10-10 MED FILL — ADDERALL XR 30 MG CAP SA: 30 | 30 days supply | Qty: 30 | Fill #0

## 2019-11-12 DIAGNOSIS — F341 Dysthymic disorder: Secondary | ICD-10-CM | POA: Diagnosis not present

## 2019-11-12 DIAGNOSIS — F900 Attention-deficit hyperactivity disorder, predominantly inattentive type: Secondary | ICD-10-CM | POA: Diagnosis not present

## 2019-11-12 DIAGNOSIS — F419 Anxiety disorder, unspecified: Secondary | ICD-10-CM | POA: Diagnosis not present

## 2019-11-12 MED FILL — ADDERALL XR 30 MG CAP SA: 30 | 30 days supply | Qty: 30 | Fill #0

## 2019-11-12 MED FILL — cloNIDine HCL 0.2 MG TABS: 0.2 | 30 days supply | Qty: 30 | Fill #0

## 2019-11-12 MED FILL — GABAPENTIN 300 MG CAPSULE: 300 | 90 days supply | Qty: 90 | Fill #0

## 2019-11-12 MED FILL — FLUoxetine HCL 40 MG CAPS: 40 | 30 days supply | Qty: 30 | Fill #0

## 2019-12-12 MED FILL — ADDERALL XR 30 MG CAP SA: 30 | 30 days supply | Qty: 30 | Fill #0

## 2020-01-07 ENCOUNTER — Other Ambulatory Visit (HOSPITAL_COMMUNITY): Payer: Self-pay | Admitting: Psychiatry

## 2020-01-07 DIAGNOSIS — F341 Dysthymic disorder: Secondary | ICD-10-CM | POA: Diagnosis not present

## 2020-01-07 DIAGNOSIS — F900 Attention-deficit hyperactivity disorder, predominantly inattentive type: Secondary | ICD-10-CM | POA: Diagnosis not present

## 2020-01-07 DIAGNOSIS — F419 Anxiety disorder, unspecified: Secondary | ICD-10-CM | POA: Diagnosis not present

## 2020-01-07 MED FILL — cloNIDine HCL 0.2 MG TABS: 0.2 | 30 days supply | Qty: 30 | Fill #0

## 2020-01-07 MED FILL — GABAPENTIN 300 MG CAPSULE: 300 | 30 days supply | Qty: 90 | Fill #0

## 2020-01-07 MED FILL — TRINTELLIX 10 MG TABLET: 10 | 30 days supply | Qty: 30 | Fill #0

## 2020-01-07 MED FILL — FLUoxetine HCL 20 MG CAPS: 20 | 30 days supply | Qty: 30 | Fill #0

## 2020-01-09 MED FILL — ADDERALL XR 30 MG CAP SA: 30 | 30 days supply | Qty: 30 | Fill #0

## 2020-02-19 ENCOUNTER — Other Ambulatory Visit (HOSPITAL_COMMUNITY): Payer: Self-pay | Admitting: Psychiatry

## 2020-02-19 MED FILL — TRINTELLIX 10 MG TABLET: 10 | 30 days supply | Qty: 30 | Fill #0

## 2020-02-19 MED FILL — ADDERALL XR 30 MG CAP SA: 30 | 30 days supply | Qty: 30 | Fill #0

## 2020-02-21 ENCOUNTER — Other Ambulatory Visit (HOSPITAL_COMMUNITY): Payer: Self-pay | Admitting: Psychiatry

## 2020-02-21 DIAGNOSIS — F900 Attention-deficit hyperactivity disorder, predominantly inattentive type: Secondary | ICD-10-CM | POA: Diagnosis not present

## 2020-02-21 DIAGNOSIS — F419 Anxiety disorder, unspecified: Secondary | ICD-10-CM | POA: Diagnosis not present

## 2020-02-21 DIAGNOSIS — F341 Dysthymic disorder: Secondary | ICD-10-CM | POA: Diagnosis not present

## 2020-02-21 MED FILL — GABAPENTIN 300 MG CAPSULE: 300 | 30 days supply | Qty: 90 | Fill #0

## 2020-02-21 MED FILL — cloNIDine HCL 0.2 MG TABS: 0.2 | 30 days supply | Qty: 30 | Fill #0

## 2020-02-21 MED FILL — TRINTELLIX 20 MG TABLET: 20 | 30 days supply | Qty: 30 | Fill #0

## 2020-03-25 MED FILL — TRINTELLIX 20 MG TABLET: 20 | 30 days supply | Qty: 30 | Fill #0

## 2020-03-25 MED FILL — ADDERALL XR 30 MG CAP SA: 30 | 30 days supply | Qty: 30 | Fill #0

## 2020-04-06 MED FILL — AMPHETAMINE-DEXTROAMPHET ER: 30 | 30 days supply | Qty: 30 | Fill #0

## 2022-04-28 ENCOUNTER — Emergency Department (HOSPITAL_COMMUNITY)
Admission: EM | Admit: 2022-04-28 | Discharge: 2022-04-28 | Disposition: A | Discharge status: Home / Self Care | Payer: Commercial Managed Care - PPO | Attending: Emergency Medicine | Admitting: Emergency Medicine

## 2022-04-28 ENCOUNTER — Emergency Department (HOSPITAL_COMMUNITY): Payer: Commercial Managed Care - PPO

## 2022-04-28 ENCOUNTER — Other Ambulatory Visit: Payer: Self-pay

## 2022-04-28 DIAGNOSIS — R42 Dizziness and giddiness: Secondary | ICD-10-CM | POA: Insufficient documentation

## 2022-04-28 DIAGNOSIS — E860 Dehydration: Secondary | ICD-10-CM | POA: Insufficient documentation

## 2022-04-28 DIAGNOSIS — R55 Syncope and collapse: Secondary | ICD-10-CM

## 2022-04-28 LAB — HEPATIC FUNCTION PANEL
ALT: 22 U/L (ref 0–44)
AST: 22 U/L (ref 15–41)
Albumin: 4.1 g/dL (ref 3.5–5.0)
Alkaline Phosphatase: 56 U/L (ref 38–126)
Bilirubin, Direct: 0.2 mg/dL (ref 0.0–0.2)
Indirect Bilirubin: 1.1 mg/dL — ABNORMAL HIGH (ref 0.3–0.9)
Total Bilirubin: 1.3 mg/dL — ABNORMAL HIGH (ref 0.3–1.2)
Total Protein: 7 g/dL (ref 6.5–8.1)

## 2022-04-28 LAB — CBC
HCT: 46.3 % (ref 39.0–52.0)
Hemoglobin: 15.9 g/dL (ref 13.0–17.0)
MCH: 29.4 pg (ref 26.0–34.0)
MCHC: 34.3 g/dL (ref 30.0–36.0)
MCV: 85.6 fL (ref 80.0–100.0)
Platelets: 266 10*3/uL (ref 150–400)
RBC: 5.41 MIL/uL (ref 4.22–5.81)
RDW: 12.8 % (ref 11.5–15.5)
WBC: 7.9 10*3/uL (ref 4.0–10.5)
nRBC: 0 % (ref 0.0–0.2)

## 2022-04-28 LAB — URINALYSIS, ROUTINE W REFLEX MICROSCOPIC
Bilirubin Urine: NEGATIVE
Glucose, UA: NEGATIVE mg/dL
Hgb urine dipstick: NEGATIVE
Ketones, ur: NEGATIVE mg/dL
Leukocytes,Ua: NEGATIVE
Nitrite: NEGATIVE
Protein, ur: NEGATIVE mg/dL
Specific Gravity, Urine: 1.012 (ref 1.005–1.030)
pH: 7 (ref 5.0–8.0)

## 2022-04-28 LAB — BASIC METABOLIC PANEL
Anion gap: 11 (ref 5–15)
BUN: 13 mg/dL (ref 6–20)
CO2: 26 mmol/L (ref 22–32)
Calcium: 10.1 mg/dL (ref 8.9–10.3)
Chloride: 100 mmol/L (ref 98–111)
Creatinine, Ser: 1.13 mg/dL (ref 0.61–1.24)
GFR, Estimated: >60 mL/min (ref >60)
Glucose, Bld: 103 mg/dL — ABNORMAL HIGH (ref 70–99)
Potassium: 4.3 mmol/L (ref 3.5–5.1)
Sodium: 137 mmol/L (ref 135–145)

## 2022-04-28 LAB — MAGNESIUM: Magnesium: 2.2 mg/dL (ref 1.7–2.4)

## 2022-04-28 LAB — CBG MONITORING, ED: Glucose-Capillary: 110 mg/dL — ABNORMAL HIGH (ref 70–99)

## 2022-04-28 LAB — TROPONIN I (HIGH SENSITIVITY)
Troponin I (High Sensitivity): 18 ng/L — ABNORMAL HIGH (ref <18)
Troponin I (High Sensitivity): 16 ng/L (ref <18)

## 2022-04-28 LAB — D-DIMER, QUANTITATIVE: D-Dimer, Quant: 0.32 ug/mL-FEU (ref 0.00–0.50)

## 2022-04-28 MED ORDER — SODIUM CHLORIDE 0.9 % IV BOLUS
1000.0000 mL | Freq: Once | INTRAVENOUS | Status: AC
  Administered 2022-04-28: 1000 mL via INTRAVENOUS

## 2022-04-28 NOTE — ED Triage Notes (Addendum)
C/o woke up from sleep and 1 min after standing up felt lightheaded and diaphoretic.   Pt reports after sitting down felt better.  Reports decreased sleep for 2 days Denies cp, sob

## 2022-04-28 NOTE — ED Provider Notes (Signed)
[place] [facility] Provider Note   CSN: [id number] Arrival date & time: 04/28/22  1745     History  Chief Complaint  Patient presents with   Dizziness    Ricky Sparks is a 27 y.o. male.  The history is provided by the patient and medical records. No language interpreter was used.  Near Syncope This is a new problem. The current episode started 1 to 2 hours ago. The problem occurs rarely. The problem has been resolved. Associated symptoms include chest pain (intermittnet and mild.). Pertinent negatives include no abdominal pain, no headaches and no shortness of breath. Nothing aggravates the symptoms. Nothing relieves the symptoms. He has tried nothing for the symptoms. The treatment provided no relief.       Home Medications Prior to Admission medications   Medication Sig Start Date End Date Taking? Authorizing Provider  amphetamine-dextroamphetamine (ADDERALL XR) 30 MG 24 hr capsule Take 30 mg by mouth daily.    [provider]  amphetamine-dextroamphetamine (ADDERALL XR) 30 MG 24 hr capsule TAKE 1 CAPSULE EVERY MORNING FOR ADHD 02/21/20 08/19/20  Corena Pilgrim, MD  amphetamine-dextroamphetamine (ADDERALL XR) 30 MG 24 hr capsule TAKE 1 CAPSULE EVERY MORNING FOR ADHD 02/19/20 08/17/20  Corena Pilgrim, MD  amphetamine-dextroamphetamine (ADDERALL XR) 30 MG 24 hr capsule TAKE 1 CAPSULE BY MOUTH EVERY MORNING FOR ADHD 01/07/20 07/05/20  Corena Pilgrim, MD  atomoxetine (STRATTERA) 18 MG capsule Take 1 capsule (18 mg total) by mouth daily. For ADD 05/26/17   Money, Lowry Ram, FNP  cloNIDine (CATAPRES) 0.2 MG tablet TAKE 1/2 TO 1 TABLET BY MOUTH AT BEDTIME FOR SLEEP 01/07/20 01/06/21  Corena Pilgrim, MD  FLUoxetine (PROZAC) 20 MG capsule TAKE 1 CAPSULE BY MOUTH ONCE DAILY FOR DEPRESSION 01/07/20 01/06/21  Corena Pilgrim, MD  gabapentin (NEURONTIN) 300 MG capsule TAKE 1 CAPSULE BY MOUTH EVERY MORNING FOR ANXIETY AND 2 CAPSULES AT  BEDTIME FOR ANXIETY/MOOD 01/07/20 01/06/21  Corena Pilgrim, MD  zolpidem (AMBIEN) 5 MG tablet Take 1 tablet (5 mg total) by mouth at bedtime as needed for sleep. 04/06/18   Garnet Sierras, NP      Allergies    Penicillins    Review of Systems   Review of Systems  Constitutional:  Negative for chills, fatigue and fever.  HENT:  Negative for congestion.   Eyes:  Negative for visual disturbance.  Respiratory:  Negative for cough, chest tightness and shortness of breath.   Cardiovascular:  Positive for chest pain (intermittnet and mild.) and near-syncope. Negative for palpitations and leg swelling.  Gastrointestinal:  Negative for abdominal pain, constipation, diarrhea, nausea and vomiting.  Genitourinary:  Negative for dysuria and flank pain.  Musculoskeletal:  Negative for neck pain and neck stiffness.  Skin:  Negative for rash and wound.  Neurological:  Positive for light-headedness. Negative for syncope, weakness and headaches.  Psychiatric/Behavioral:  Negative for agitation and confusion.   All other systems reviewed and are negative.   Physical Exam Updated Vital Signs BP (!) 112/90 (BP Location: Left Arm)   Pulse (!) 101   Temp (!) 97.4 F (36.3 C) (Oral)   Resp 16   Wt 75 kg   SpO2 99%   BMI 23.06 kg/m  Physical Exam Vitals and nursing note reviewed.  Constitutional:      General: He is not in acute distress.    Appearance: He is well-developed. He is not ill-appearing, toxic-appearing or diaphoretic.  HENT:     Head: Normocephalic and atraumatic.  Eyes:     Conjunctiva/sclera: Conjunctivae normal.  Cardiovascular:     Rate and Rhythm: Regular rhythm. Tachycardia present.     Pulses: Normal pulses.     Heart sounds: No murmur heard. Pulmonary:     Effort: Pulmonary effort is normal. No respiratory distress.     Breath sounds: Normal breath sounds. No wheezing, rhonchi or rales.  Chest:     Chest wall: No tenderness.  Abdominal:     General: Abdomen is  flat.     Palpations: Abdomen is soft.     Tenderness: There is no abdominal tenderness. There is no right CVA tenderness, left CVA tenderness, guarding or rebound.  Musculoskeletal:        General: No swelling or tenderness.     Cervical back: Neck supple. No tenderness.     Right lower leg: No edema.     Left lower leg: No edema.  Skin:    General: Skin is warm and dry.     Capillary Refill: Capillary refill takes less than 2 seconds.     Findings: No erythema or rash.  Neurological:     General: No focal deficit present.     Mental Status: He is alert.     Sensory: No sensory deficit.     Motor: No weakness.  Psychiatric:        Mood and Affect: Mood normal.     ED Results / Procedures / Treatments   Labs (all labs ordered are listed, but only abnormal results are displayed) Labs Reviewed  BASIC METABOLIC PANEL - Abnormal; Notable for the following components:      Result Value   Glucose, Bld 103 (*)    All other components within normal limits  HEPATIC FUNCTION PANEL - Abnormal; Notable for the following components:   Total Bilirubin 1.3 (*)    Indirect Bilirubin 1.1 (*)    All other components within normal limits  CBG MONITORING, ED - Abnormal; Notable for the following components:   Glucose-Capillary 110 (*)    All other components within normal limits  TROPONIN I (HIGH SENSITIVITY) - Abnormal; Notable for the following components:   Troponin I (High Sensitivity) 18 (*)    All other components within normal limits  CBC  URINALYSIS, ROUTINE W REFLEX MICROSCOPIC  MAGNESIUM  D-DIMER, QUANTITATIVE  TSH  CBG MONITORING, ED  TROPONIN I (HIGH SENSITIVITY)    EKG EKG Interpretation  Date/Time:  Thursday April 28 2022 17:59:51 EST Ventricular Rate:  97 PR Interval:  133 QRS Duration: 100 QT Interval:  338 QTC Calculation: 430 R Axis:   78 Text Interpretation: Sinus rhythm Consider right atrial enlargement Probable inferior infarct, old when compared to  prior, similar to prior. No STEMI Confirmed by Antony Blackbird [phone]) on 04/28/2022 6:39:34 PM  Radiology DG Chest 2 View  Result Date: 04/28/2022 CLINICAL DATA:  Chest pain EXAM: CHEST - 2 VIEW COMPARISON:  Chest x-ray 01/25/1999 FINDINGS: The heart size and mediastinal contours are within normal limits. Both lungs are clear. The visualized skeletal structures are unremarkable. IMPRESSION: No active cardiopulmonary disease. Electronically Signed   By: Ronney Asters M.D.   On: 04/28/2022 19:49    Procedures Procedures    Medications Ordered in ED Medications  sodium chloride 0.9 % bolus 1,000 mL (0 mLs Intravenous Stopped 04/28/22 2252)    ED Course/ Medical Decision Making/ A&P  Medical Decision Making Amount and/or Complexity of Data Reviewed Labs: ordered.    KASTIEL GADBOIS is a 27 y.o. male with a past medical history significant for ADD, anxiety, and schizoaffective disorder who presents with near syncope.  According to patient, he has not been drinking water for the last few days as he felt it tasted weird.  He says he does drink energy drinks.  He said he has had a month or 2 of intermittent chest discomfort in his left chest but not constant.  He do not feel it today.  He reports he was taking a nap and then after getting up walk to the kitchen then got very lightheaded.  He was guided to the ground but did not lose consciousness.  He dropped a bowl of lasagna onto his left foot where he has a very small skin tear.  No active bleeding now.  He reports no palpitations or shortness of breath and denied any nausea or vomiting.  He denies other complaints.  Denies any diarrhea or GI symptoms.  He does report he has had some urinary frequency recently.  On exam, lungs clear and chest nontender.  He reports sometimes the chest is tender but I can appreciated.  No murmur on my exam.  Good pulses in extremities.  Legs nontender and nonedematous.  1 mm skin tear  to the dorsum of left foot that is nontender and hemostatic.  Will give a Band-Aid.  Due to the patient's intermittent chest discomfort and the near syncope we will get workup including D-dimer, delta troponin, and labs.  Chest x-ray reassuring.  Urinalysis for the frequency was negative for infection.  Dimer negative, delta troponin negative with both less than 20.  CBC and metabolic panel reassuring.  EKG reassuring similar to prior.  Patient feels much better after fluids.  Suspect dehydration and near syncope.  Patient agrees with PCP follow-up and discharge.  Patient with questions or concerns and was discharged in stable condition with resolution of lightheadedness and near syncope and overall reassuring workup.          Final Clinical Impression(s) / ED Diagnoses Final diagnoses:  Dehydration  Near syncope  Lightheadedness   Clinical Impression: 1. Dehydration   2. Near syncope   3. Lightheadedness     Disposition: Discharge  Condition: Good  I have discussed the results, Dx and Tx plan with the pt(& family if present). He/she/they expressed understanding and agree(s) with the plan. Discharge instructions discussed at great length. Strict return precautions discussed and pt &/or family have verbalized understanding of the instructions. No further questions at time of discharge.    New Prescriptions   No medications on file    Follow Up: [place] [address] [place] [phone] Schedule an appointment as soon as possible for a visit    [facility] Emergency Department at [facility] [address] [place]       Verdean Murin, Gwenyth Allegra, MD 04/28/22 [phone]

## 2022-04-28 NOTE — Discharge Instructions (Signed)
Your history, exam, workup today were overall reassuring.  I suspect you are dehydrated after not drinking fluids for the last few days leading to this near syncopal or near passing out episode after standing.  Your workup did not show evidence of blood clot and after rehydration you are feeling better.  We feel you are safe for discharge home.  Please follow-up with your primary doctor.  Please rest and stay hydrated.  If any symptoms change or worsen acutely, please return to the nearest emergency department.

## 2022-11-02 ENCOUNTER — Ambulatory Visit (INDEPENDENT_AMBULATORY_CARE_PROVIDER_SITE_OTHER): Payer: Worker's Compensation

## 2022-11-02 ENCOUNTER — Ambulatory Visit (HOSPITAL_COMMUNITY)
Admission: EM | Admit: 2022-11-02 | Discharge: 2022-11-02 | Disposition: A | Discharge status: Home / Self Care | Payer: Worker's Compensation | Attending: Emergency Medicine | Admitting: Emergency Medicine

## 2022-11-02 ENCOUNTER — Encounter (HOSPITAL_COMMUNITY): Payer: Self-pay

## 2022-11-02 ENCOUNTER — Other Ambulatory Visit (HOSPITAL_COMMUNITY): Payer: Self-pay

## 2022-11-02 DIAGNOSIS — S91332A Puncture wound without foreign body, left foot, initial encounter: Secondary | ICD-10-CM | POA: Diagnosis not present

## 2022-11-02 DIAGNOSIS — M79671 Pain in right foot: Secondary | ICD-10-CM | POA: Diagnosis not present

## 2022-11-02 DIAGNOSIS — Z23 Encounter for immunization: Secondary | ICD-10-CM

## 2022-11-02 MED ORDER — CEPHALEXIN 500 MG PO CAPS
500.0000 mg | ORAL_CAPSULE | Freq: Two times a day (BID) | ORAL | 0 refills | Status: AC
  Filled 2022-11-02: qty 14, 7d supply, fill #0

## 2022-11-02 MED ORDER — TETANUS-DIPHTH-ACELL PERTUSSIS 5-2.5-18.5 LF-MCG/0.5 IM SUSY
0.5000 mL | PREFILLED_SYRINGE | Freq: Once | INTRAMUSCULAR | Status: AC
  Administered 2022-11-02: 0.5 mL via INTRAMUSCULAR

## 2022-11-02 MED ORDER — TETANUS-DIPHTH-ACELL PERTUSSIS 5-2.5-18.5 LF-MCG/0.5 IM SUSY
PREFILLED_SYRINGE | INTRAMUSCULAR | Status: AC
  Filled 2022-11-02: qty 0.5

## 2022-11-02 MED ORDER — IBUPROFEN 800 MG PO TABS
ORAL_TABLET | ORAL | Status: AC
  Filled 2022-11-02: qty 1

## 2022-11-02 MED ORDER — IBUPROFEN 800 MG PO TABS
800.0000 mg | ORAL_TABLET | Freq: Once | ORAL | Status: AC
  Administered 2022-11-02: 800 mg via ORAL

## 2022-11-02 NOTE — Discharge Instructions (Addendum)
There is no evidence of retained foreign body on your imaging.  Please keep your wound clean and dry.  You can soak it in warm water and an antibacterial solution like Dial or Hibiclens 2-3 times daily, afterwards please pat dry and apply an over-the-counter antibacterial ointment like Neosporin.  We have updated your Tdap today, you are covered  for the next 5 years for any dirty, contaminated puncture wounds.  For pain and inflammation you can take 800 mg of ibuprofen every 8 hours for pain and inflammation.  You can also ice and elevate the area.  I am starting you on Keflex, take this with food.  Stop taking this antibiotic immediately if you develop any rash, shortness of breath, or new concerning symptoms.  Return to clinic for any new or urgent symptoms.

## 2022-11-02 NOTE — ED Provider Notes (Signed)
[facility]    CSN: [id number] Arrival date & time: 11/02/22  1618      History   Chief Complaint Chief Complaint  Patient presents with   Foot Injury    HPI Ricky Sparks is a 27 y.o. male.   Patient presents to clinic for complaints of stepping on a dirty screw earlier today. He was wearing tennis shoes and it penetrated through his shoes. Injury happened around 11am today. He is ambulatory with pain and discomfort.   He is unsure of last Tdap, has been well over 5 years.   He has not taken any medications or cleaned the area.   The history is provided by the patient and medical records.  Foot Injury   Past Medical History:  Diagnosis Date   ADD (attention deficit disorder) 2009    Patient Active Problem List   Diagnosis Date Noted   Insomnia 04/05/2018   Other psychoactive substance use, unspecified with psychoactive substance-induced mood disorder (HCC) 03/23/2018   MDD (major depressive disorder) 05/24/2017   Schizoaffective disorder (HCC) 05/20/2017   Anxiety tension state 05/03/2017   Dysthymia 08/26/2013   ADD (attention deficit disorder) 05/01/2013    History reviewed. No pertinent surgical history.     Home Medications    Prior to Admission medications   Medication Sig Start Date End Date Taking? Authorizing Provider  cephALEXin (KEFLEX) 500 MG capsule Take 1 capsule (500 mg total) by mouth 2 (two) times daily for 7 days. 11/02/22 11/09/22 Yes Rinaldo Ratel, [place], FNP  amphetamine-dextroamphetamine (ADDERALL XR) 30 MG 24 hr capsule Take 30 mg by mouth daily.    [provider]  amphetamine-dextroamphetamine (ADDERALL XR) 30 MG 24 hr capsule TAKE 1 CAPSULE EVERY MORNING FOR ADHD 02/21/20 08/19/20  Thedore Mins, MD  amphetamine-dextroamphetamine (ADDERALL XR) 30 MG 24 hr capsule TAKE 1 CAPSULE EVERY MORNING FOR ADHD 02/19/20 08/17/20  Thedore Mins, MD  amphetamine-dextroamphetamine (ADDERALL XR) 30 MG 24 hr capsule TAKE 1  CAPSULE BY MOUTH EVERY MORNING FOR ADHD 01/07/20 07/05/20  Thedore Mins, MD  gabapentin (NEURONTIN) 300 MG capsule TAKE 1 CAPSULE BY MOUTH EVERY MORNING FOR ANXIETY AND 2 CAPSULES AT BEDTIME FOR ANXIETY/MOOD 01/07/20 01/06/21  Thedore Mins, MD    Family History History reviewed. No pertinent family history.  Social History Social History   Tobacco Use   Smoking status: Never   Smokeless tobacco: Never  Vaping Use   Vaping status: Never Used  Substance Use Topics   Alcohol use: Yes    Comment: occasionally   Drug use: No     Allergies   Penicillins   Review of Systems Review of Systems  Skin:  Positive for wound.     Physical Exam Triage Vital Signs ED Triage Vitals  Encounter Vitals Group     BP 11/02/22 1650 121/80     Systolic BP Percentile --      Diastolic BP Percentile --      Pulse Rate 11/02/22 1650 71     Resp 11/02/22 1650 16     Temp 11/02/22 1650 98 F (36.7 C)     Temp Source 11/02/22 1650 Oral     SpO2 11/02/22 1650 98 %     Weight 11/02/22 1650 210 lb (95.3 kg)     Height 11/02/22 1650 5\' 11"  (1.803 m)     Head Circumference --      Peak Flow --      Pain Score 11/02/22 1648 8  Pain Loc --      Pain Education --      Exclude from Growth Chart --    No data found.  Updated Vital Signs BP 121/80 (BP Location: Left Arm)   Pulse 71   Temp 98 F (36.7 C) (Oral)   Resp 16   Ht 5\' 11"  (1.803 m)   Wt 210 lb (95.3 kg)   SpO2 98%   BMI 29.29 kg/m   Visual Acuity Right Eye Distance:   Left Eye Distance:   Bilateral Distance:    Right Eye Near:   Left Eye Near:    Bilateral Near:     Physical Exam Vitals and nursing note reviewed.  Constitutional:      Appearance: Normal appearance.  HENT:     Head: Normocephalic and atraumatic.     Right Ear: External ear normal.     Left Ear: External ear normal.     Nose: Nose normal.     Mouth/Throat:     Mouth: Mucous membranes are moist.  Eyes:     Conjunctiva/sclera:  Conjunctivae normal.  Cardiovascular:     Rate and Rhythm: Normal rate.     Pulses: Normal pulses.  Pulmonary:     Effort: Pulmonary effort is normal.  Musculoskeletal:        General: Normal range of motion.  Skin:    General: Skin is warm and dry.     Capillary Refill: Capillary refill takes less than 2 seconds.     Findings: Wound present.     Comments: Small puncture wound to sole of right foot. Brisk capillary refill, able to move all toes. Pedal pulses 2+.   Neurological:     General: No focal deficit present.     Mental Status: He is alert and oriented to person, place, and time.  Psychiatric:        Mood and Affect: Mood normal.        Behavior: Behavior normal. Behavior is cooperative.      UC Treatments / Results  Labs (all labs ordered are listed, but only abnormal results are displayed) Labs Reviewed - No data to display  EKG   Radiology No results found.  Procedures Procedures (including critical care time)  Medications Ordered in UC Medications  Tdap (BOOSTRIX) injection 0.5 mL (0.5 mLs Intramuscular Given 11/02/22 1735)  ibuprofen (ADVIL) tablet 800 mg (800 mg Oral Given 11/02/22 1735)    Initial Impression / Assessment and Plan / UC Course  I have reviewed the triage vital signs and the nursing notes.  Pertinent labs & imaging results that were available during my care of the patient were reviewed by me and considered in my medical decision making (see chart for details).  Vitals and triage reviewed, patient is hemodynamically stable.  Small puncture wound to the sole of the right foot.  Imaging negative for any retained foreign bodies, no acute fractures.  Awaiting official radiology over-read.  Tdap updated in clinic.  Wound care discussed.  Patient has had a mild rash in the past due to penicillin, placed on Keflex. Wound care discussed. POC, f/u care and return precautions given, no questions at this time.      Final Clinical Impressions(s) / UC  Diagnoses   Final diagnoses:  Puncture wound to foot, left, initial encounter     Discharge Instructions      There is no evidence of retained foreign body on your imaging.  Please keep your wound clean and dry.  You can soak it in warm water and an antibacterial solution like Dial or Hibiclens 2-3 times daily, afterwards please pat dry and apply an over-the-counter antibacterial ointment like Neosporin.  We have updated your Tdap today, you are covered  for the next 5 years for any dirty, contaminated puncture wounds.  For pain and inflammation you can take 800 mg of ibuprofen every 8 hours for pain and inflammation.  You can also ice and elevate the area.  I am starting you on Keflex, take this with food.  Stop taking this antibiotic immediately if you develop any rash, shortness of breath, or new concerning symptoms.  Return to clinic for any new or urgent symptoms.     ED Prescriptions     Medication Sig Dispense Auth. Provider   cephALEXin (KEFLEX) 500 MG capsule Take 1 capsule (500 mg total) by mouth 2 (two) times daily for 7 days. 14 capsule Jaquelinne Glendening, [place], [place]      PDMP not reviewed this encounter.   Carrianne Hyun, [place] N, [place] 11/02/22 1739

## 2022-11-02 NOTE — ED Triage Notes (Signed)
Patient here today with c/o puncture to bottom of right foot after stepping on a screw. Patient was wearing his shoe. Increased pain ans swelling. Needs updated Tetanus vaccine.

## 2022-11-03 ENCOUNTER — Other Ambulatory Visit (HOSPITAL_COMMUNITY): Payer: Self-pay

## 2022-12-13 DIAGNOSIS — Z419 Encounter for procedure for purposes other than remedying health state, unspecified: Secondary | ICD-10-CM | POA: Diagnosis not present

## 2023-01-13 DIAGNOSIS — Z419 Encounter for procedure for purposes other than remedying health state, unspecified: Secondary | ICD-10-CM | POA: Diagnosis not present

## 2023-02-12 DIAGNOSIS — Z419 Encounter for procedure for purposes other than remedying health state, unspecified: Secondary | ICD-10-CM | POA: Diagnosis not present

## 2023-03-15 DIAGNOSIS — Z419 Encounter for procedure for purposes other than remedying health state, unspecified: Secondary | ICD-10-CM | POA: Diagnosis not present

## 2023-04-15 DIAGNOSIS — Z419 Encounter for procedure for purposes other than remedying health state, unspecified: Secondary | ICD-10-CM | POA: Diagnosis not present

## 2023-05-13 DIAGNOSIS — Z419 Encounter for procedure for purposes other than remedying health state, unspecified: Secondary | ICD-10-CM | POA: Diagnosis not present

## 2023-06-24 DIAGNOSIS — Z419 Encounter for procedure for purposes other than remedying health state, unspecified: Secondary | ICD-10-CM | POA: Diagnosis not present

## 2023-07-24 DIAGNOSIS — Z419 Encounter for procedure for purposes other than remedying health state, unspecified: Secondary | ICD-10-CM | POA: Diagnosis not present

## 2023-08-24 DIAGNOSIS — Z419 Encounter for procedure for purposes other than remedying health state, unspecified: Secondary | ICD-10-CM | POA: Diagnosis not present

## 2023-09-23 DIAGNOSIS — Z419 Encounter for procedure for purposes other than remedying health state, unspecified: Secondary | ICD-10-CM | POA: Diagnosis not present

## 2023-10-24 DIAGNOSIS — Z419 Encounter for procedure for purposes other than remedying health state, unspecified: Secondary | ICD-10-CM | POA: Diagnosis not present

## 2023-11-24 DIAGNOSIS — Z419 Encounter for procedure for purposes other than remedying health state, unspecified: Secondary | ICD-10-CM | POA: Diagnosis not present

## 2024-01-24 DIAGNOSIS — Z419 Encounter for procedure for purposes other than remedying health state, unspecified: Secondary | ICD-10-CM | POA: Diagnosis not present

## 2024-04-02 ENCOUNTER — Encounter (HOSPITAL_COMMUNITY): Payer: Self-pay

## 2024-04-02 ENCOUNTER — Telehealth: Admitting: Physician Assistant

## 2024-04-02 ENCOUNTER — Other Ambulatory Visit (HOSPITAL_COMMUNITY): Payer: Self-pay

## 2024-04-02 DIAGNOSIS — R051 Acute cough: Secondary | ICD-10-CM

## 2024-04-02 DIAGNOSIS — J029 Acute pharyngitis, unspecified: Secondary | ICD-10-CM | POA: Diagnosis not present

## 2024-04-02 DIAGNOSIS — R6889 Other general symptoms and signs: Secondary | ICD-10-CM | POA: Diagnosis not present

## 2024-04-02 MED ORDER — FLUTICASONE PROPIONATE 50 MCG/ACT NA SUSP
2.0000 | Freq: Every day | NASAL | 0 refills | Status: AC
  Filled 2024-04-02: qty 16, 30d supply, fill #0

## 2024-04-02 MED ORDER — NAPROXEN 500 MG PO TABS
500.0000 mg | ORAL_TABLET | Freq: Two times a day (BID) | ORAL | 0 refills | Status: AC
  Filled 2024-04-02: qty 30, 15d supply, fill #0

## 2024-04-02 MED ORDER — LIDOCAINE VISCOUS HCL 2 % MT SOLN
5.0000 mL | Freq: Four times a day (QID) | OROMUCOSAL | 0 refills | Status: AC | PRN
  Filled 2024-04-02: qty 100, 3d supply, fill #0

## 2024-04-02 MED ORDER — BENZONATATE 100 MG PO CAPS
100.0000 mg | ORAL_CAPSULE | Freq: Three times a day (TID) | ORAL | 0 refills | Status: AC | PRN
  Filled 2024-04-02: qty 30, 5d supply, fill #0

## 2024-04-02 NOTE — Progress Notes (Signed)
 E visit for Flu like symptoms   We are sorry that you are not feeling well.  Here is how we plan to help! Based on what you have shared with me it looks like you may have flu-like symptoms that should be watched but do not seem to indicate anti-viral treatment.  Influenza or the flu is  an infection caused by a respiratory virus. The flu virus is highly contagious and persons who did not receive their yearly flu vaccination may catch the flu from close contact.  We have anti-viral medications to treat the viruses that cause this infection. They are not a cure and only shorten the course of the infection. These prescriptions are most effective when they are given within the first 2 days of flu symptoms.   For nasal congestion, you may use an oral decongestant such as Mucinex D or if you have glaucoma or high blood pressure use plain Mucinex.  Saline nasal spray or nasal drops can help and can safely be used as often as needed for congestion.  If you have a sore or scratchy throat, use a saltwater gargle-  to  teaspoon of salt dissolved in a 4-ounce to 8-ounce glass of warm water.  Gargle the solution for approximately 15-30 seconds and then spit.  It is important not to swallow the solution.  You can also use throat lozenges/cough drops and Chloraseptic spray to help with throat pain or discomfort.  Warm or cold liquids can also be helpful in relieving throat pain.  For headache, pain or general discomfort, you can use Ibuprofen  or Tylenol  as directed.   Some authorities believe that zinc sprays or the use of Echinacea may shorten the course of your symptoms.  I have prescribed the following medications to help lessen symptoms: I have prescribed Tessalon  Perles 100 mg. You may take 1-2 capsules every 8 hours as needed for cough, I have prescribed an anti-inflammatory - Naprosyn  500 mg. Take twice daily as needed for fever or body aches for 2 weeks, and I have prescribed Fluticasone  nasal  spray 2 sprays in each nostril one time per dayasal spray 2 sprays in each nostril one time per day, and Viscous Lidocaine  2% swallow 5-10mL every 6 hours as needed for sore throat.  You are to isolate at home until you have been fever-free for at least 24 hours without a fever-reducing medication, and symptoms have been steadily improving for 24 hours.  If you must be around other household members who do not have symptoms, you need to make sure that both you and the family members are masking consistently with a high-quality mask.  If you note any worsening of symptoms despite treatment, please seek an in-person evaluation ASAP. If you note any significant shortness of breath or any chest pain, please seek ED evaluation. Please do not delay care!  ANYONE WHO HAS FLU SYMPTOMS SHOULD: Stay home. The flu is highly contagious and going out or to work exposes others! Be sure to drink plenty of fluids. Water is fine as well as fruit juices, sodas and electrolyte beverages. You may want to stay away from caffeine or alcohol. If you are nauseated, try taking small sips of liquids. How do you know if you are getting enough fluid? Your urine should be a pale yellow or almost colorless. Get rest. Taking a steamy shower or using a humidifier may help nasal congestion and ease sore throat pain. Using a saline nasal spray works much the same way. Cough drops,  hard candies and sore throat lozenges may ease your cough. Line up a caregiver. Have someone check on you regularly.  GET HELP RIGHT AWAY IF: You cannot keep down liquids or your medications. You become short of breath Your fell like you are going to pass out or loose consciousness. Your symptoms persist after you have completed your treatment plan  MAKE SURE YOU  Understand these instructions. Will watch your condition. Will get help right away if you are not doing well or get worse.  Your e-visit answers were reviewed by a board certified  advanced clinical practitioner to complete your personal care plan.  Depending on the condition, your plan could have included both over the counter or prescription medications.  If there is a problem please reply  once you have received a response from your provider.  Your safety is important to us .  If you have drug allergies check your prescription carefully.    You can use MyChart to ask questions about todays visit, request a non-urgent call back, or ask for a work or school excuse for 24 hours related to this e-Visit. If it has been greater than 24 hours you will need to follow up with your provider, or enter a new e-Visit to address those concerns.  You will get an e-mail in the next two days asking about your experience.  I hope that your e-visit has been valuable and will speed your recovery. Thank you for using e-visits.   I have spent 5 minutes in review of e-visit questionnaire, review and updating patient chart, medical decision making and response to patient.   Delon CHRISTELLA Dickinson, PA-C

## 2024-05-30 ENCOUNTER — Ambulatory Visit: Admitting: Nurse Practitioner
